# Patient Record
Sex: Female | Born: 1935 | Race: Black or African American | Hispanic: No | Marital: Single | Smoking: Former smoker
Health system: Southern US, Community
[De-identification: ages and names within clinical notes are randomized; demographics above are authoritative.]

## PROBLEM LIST (undated history)

## (undated) DIAGNOSIS — I5189 Other ill-defined heart diseases: Secondary | ICD-10-CM

## (undated) DIAGNOSIS — I200 Unstable angina: Secondary | ICD-10-CM

## (undated) DIAGNOSIS — E785 Hyperlipidemia, unspecified: Secondary | ICD-10-CM

## (undated) DIAGNOSIS — J449 Chronic obstructive pulmonary disease, unspecified: Secondary | ICD-10-CM

## (undated) DIAGNOSIS — I119 Hypertensive heart disease without heart failure: Secondary | ICD-10-CM

## (undated) DIAGNOSIS — K5790 Diverticulosis of intestine, part unspecified, without perforation or abscess without bleeding: Secondary | ICD-10-CM

## (undated) DIAGNOSIS — T7840XA Allergy, unspecified, initial encounter: Secondary | ICD-10-CM

## (undated) DIAGNOSIS — D126 Benign neoplasm of colon, unspecified: Secondary | ICD-10-CM

## (undated) DIAGNOSIS — K219 Gastro-esophageal reflux disease without esophagitis: Secondary | ICD-10-CM

## (undated) DIAGNOSIS — D649 Anemia, unspecified: Secondary | ICD-10-CM

## (undated) HISTORY — DX: Chronic obstructive pulmonary disease, unspecified: J44.9

## (undated) HISTORY — DX: Hyperlipidemia, unspecified: E78.5

## (undated) HISTORY — DX: Unstable angina: I20.0

## (undated) HISTORY — DX: Anemia, unspecified: D64.9

## (undated) HISTORY — DX: Hypertensive heart disease without heart failure: I11.9

## (undated) HISTORY — DX: Gastro-esophageal reflux disease without esophagitis: K21.9

## (undated) HISTORY — DX: Diverticulosis of intestine, part unspecified, without perforation or abscess without bleeding: K57.90

## (undated) HISTORY — DX: Other ill-defined heart diseases: I51.89

## (undated) HISTORY — DX: Allergy, unspecified, initial encounter: T78.40XA

## (undated) HISTORY — DX: Benign neoplasm of colon, unspecified: D12.6

---

## 1997-07-08 ENCOUNTER — Emergency Department (HOSPITAL_COMMUNITY): Admission: EM | Admit: 1997-07-08 | Discharge: 1997-07-08 | Payer: Self-pay | Admitting: Emergency Medicine

## 1997-07-14 ENCOUNTER — Emergency Department (HOSPITAL_COMMUNITY): Admission: EM | Admit: 1997-07-14 | Discharge: 1997-07-14 | Payer: Self-pay | Admitting: Emergency Medicine

## 1997-10-08 ENCOUNTER — Emergency Department (HOSPITAL_COMMUNITY): Admission: EM | Admit: 1997-10-08 | Discharge: 1997-10-09 | Payer: Self-pay | Admitting: Emergency Medicine

## 1998-05-17 ENCOUNTER — Emergency Department (HOSPITAL_COMMUNITY): Admission: EM | Admit: 1998-05-17 | Discharge: 1998-05-17 | Payer: Self-pay | Admitting: Emergency Medicine

## 1999-01-09 HISTORY — PX: CARDIAC CATHETERIZATION: SHX172

## 1999-07-05 ENCOUNTER — Emergency Department (HOSPITAL_COMMUNITY): Admission: EM | Admit: 1999-07-05 | Discharge: 1999-07-05 | Payer: Self-pay | Admitting: Emergency Medicine

## 1999-08-07 ENCOUNTER — Emergency Department (HOSPITAL_COMMUNITY): Admission: EM | Admit: 1999-08-07 | Discharge: 1999-08-07 | Payer: Self-pay | Admitting: Emergency Medicine

## 1999-08-27 ENCOUNTER — Emergency Department (HOSPITAL_COMMUNITY): Admission: EM | Admit: 1999-08-27 | Discharge: 1999-08-27 | Payer: Self-pay | Admitting: Emergency Medicine

## 1999-09-13 ENCOUNTER — Encounter: Payer: Self-pay | Admitting: Emergency Medicine

## 1999-09-13 ENCOUNTER — Emergency Department (HOSPITAL_COMMUNITY): Admission: EM | Admit: 1999-09-13 | Discharge: 1999-09-13 | Payer: Self-pay | Admitting: Emergency Medicine

## 1999-09-14 ENCOUNTER — Inpatient Hospital Stay (HOSPITAL_COMMUNITY): Admission: EM | Admit: 1999-09-14 | Discharge: 1999-09-16 | Payer: Self-pay | Admitting: *Deleted

## 1999-09-16 ENCOUNTER — Encounter: Payer: Self-pay | Admitting: Gastroenterology

## 1999-10-09 DIAGNOSIS — D126 Benign neoplasm of colon, unspecified: Secondary | ICD-10-CM

## 1999-10-09 HISTORY — DX: Benign neoplasm of colon, unspecified: D12.6

## 1999-10-13 ENCOUNTER — Ambulatory Visit: Admission: RE | Admit: 1999-10-13 | Discharge: 1999-10-13 | Payer: Self-pay | Admitting: Pulmonary Disease

## 1999-10-31 ENCOUNTER — Encounter: Payer: Self-pay | Admitting: Gastroenterology

## 1999-10-31 ENCOUNTER — Encounter (INDEPENDENT_AMBULATORY_CARE_PROVIDER_SITE_OTHER): Payer: Self-pay

## 1999-10-31 ENCOUNTER — Other Ambulatory Visit: Admission: RE | Admit: 1999-10-31 | Discharge: 1999-10-31 | Payer: Self-pay | Admitting: Gastroenterology

## 1999-11-15 ENCOUNTER — Encounter: Payer: Self-pay | Admitting: Internal Medicine

## 2000-03-01 ENCOUNTER — Other Ambulatory Visit: Admission: RE | Admit: 2000-03-01 | Discharge: 2000-03-01 | Payer: Self-pay | Admitting: Internal Medicine

## 2000-04-30 ENCOUNTER — Emergency Department (HOSPITAL_COMMUNITY): Admission: EM | Admit: 2000-04-30 | Discharge: 2000-05-01 | Payer: Self-pay

## 2001-05-10 ENCOUNTER — Emergency Department (HOSPITAL_COMMUNITY): Admission: EM | Admit: 2001-05-10 | Discharge: 2001-05-10 | Payer: Self-pay | Admitting: Emergency Medicine

## 2001-11-21 ENCOUNTER — Encounter: Payer: Self-pay | Admitting: Internal Medicine

## 2003-02-10 ENCOUNTER — Encounter: Admission: RE | Admit: 2003-02-10 | Discharge: 2003-02-10 | Payer: Self-pay | Admitting: Internal Medicine

## 2003-03-03 ENCOUNTER — Encounter: Admission: RE | Admit: 2003-03-03 | Discharge: 2003-03-03 | Payer: Self-pay | Admitting: Internal Medicine

## 2003-04-17 ENCOUNTER — Inpatient Hospital Stay (HOSPITAL_COMMUNITY): Admission: EM | Admit: 2003-04-17 | Discharge: 2003-04-22 | Payer: Self-pay | Admitting: Emergency Medicine

## 2003-05-12 ENCOUNTER — Encounter: Payer: Self-pay | Admitting: Internal Medicine

## 2003-10-13 ENCOUNTER — Encounter: Payer: Self-pay | Admitting: Internal Medicine

## 2004-01-21 ENCOUNTER — Ambulatory Visit: Payer: Self-pay | Admitting: Family Medicine

## 2004-01-24 ENCOUNTER — Ambulatory Visit: Payer: Self-pay | Admitting: Internal Medicine

## 2004-02-02 ENCOUNTER — Encounter: Payer: Self-pay | Admitting: Internal Medicine

## 2004-09-07 ENCOUNTER — Ambulatory Visit: Payer: Self-pay | Admitting: Internal Medicine

## 2004-09-29 ENCOUNTER — Ambulatory Visit: Payer: Self-pay | Admitting: Gastroenterology

## 2004-10-16 LAB — HM COLONOSCOPY

## 2004-10-26 ENCOUNTER — Encounter: Payer: Self-pay | Admitting: Internal Medicine

## 2004-10-26 ENCOUNTER — Ambulatory Visit: Payer: Self-pay | Admitting: Gastroenterology

## 2004-12-07 ENCOUNTER — Ambulatory Visit: Payer: Self-pay | Admitting: Internal Medicine

## 2005-04-05 ENCOUNTER — Ambulatory Visit: Payer: Self-pay | Admitting: Internal Medicine

## 2005-05-21 ENCOUNTER — Ambulatory Visit: Payer: Self-pay | Admitting: Internal Medicine

## 2005-05-25 ENCOUNTER — Ambulatory Visit: Payer: Self-pay | Admitting: Internal Medicine

## 2005-11-21 ENCOUNTER — Ambulatory Visit: Payer: Self-pay | Admitting: Internal Medicine

## 2006-04-12 ENCOUNTER — Ambulatory Visit: Payer: Self-pay | Admitting: Internal Medicine

## 2006-05-01 ENCOUNTER — Ambulatory Visit: Payer: Self-pay | Admitting: Emergency Medicine

## 2006-06-05 ENCOUNTER — Ambulatory Visit: Payer: Self-pay | Admitting: Emergency Medicine

## 2006-06-26 ENCOUNTER — Ambulatory Visit: Payer: Self-pay | Admitting: Cardiology

## 2006-07-03 ENCOUNTER — Ambulatory Visit: Payer: Self-pay | Admitting: Cardiology

## 2006-07-18 ENCOUNTER — Ambulatory Visit: Payer: Self-pay | Admitting: Emergency Medicine

## 2006-07-22 ENCOUNTER — Ambulatory Visit: Payer: Self-pay

## 2006-07-22 ENCOUNTER — Encounter: Payer: Self-pay | Admitting: Cardiology

## 2006-08-20 ENCOUNTER — Ambulatory Visit: Payer: Self-pay | Admitting: Family Medicine

## 2006-08-20 ENCOUNTER — Encounter: Payer: Self-pay | Admitting: Internal Medicine

## 2006-08-21 DIAGNOSIS — Z860100 Personal history of colon polyps, unspecified: Secondary | ICD-10-CM | POA: Insufficient documentation

## 2006-08-21 DIAGNOSIS — E785 Hyperlipidemia, unspecified: Secondary | ICD-10-CM

## 2006-08-21 DIAGNOSIS — I10 Essential (primary) hypertension: Secondary | ICD-10-CM | POA: Insufficient documentation

## 2006-08-27 ENCOUNTER — Ambulatory Visit: Payer: Self-pay | Admitting: Emergency Medicine

## 2006-10-03 ENCOUNTER — Ambulatory Visit: Payer: Self-pay | Admitting: Emergency Medicine

## 2006-10-10 ENCOUNTER — Ambulatory Visit: Payer: Self-pay | Admitting: Emergency Medicine

## 2006-10-10 ENCOUNTER — Ambulatory Visit: Admission: RE | Admit: 2006-10-10 | Discharge: 2006-10-10 | Payer: Self-pay | Admitting: Emergency Medicine

## 2006-10-10 ENCOUNTER — Encounter: Payer: Self-pay | Admitting: Emergency Medicine

## 2006-10-23 ENCOUNTER — Ambulatory Visit: Payer: Self-pay | Admitting: Internal Medicine

## 2006-11-05 ENCOUNTER — Encounter: Admission: RE | Admit: 2006-11-05 | Discharge: 2007-01-08 | Payer: Self-pay | Admitting: Emergency Medicine

## 2006-11-18 ENCOUNTER — Ambulatory Visit: Payer: Self-pay | Admitting: Emergency Medicine

## 2007-04-21 ENCOUNTER — Telehealth (INDEPENDENT_AMBULATORY_CARE_PROVIDER_SITE_OTHER): Payer: Self-pay | Admitting: *Deleted

## 2007-04-28 ENCOUNTER — Ambulatory Visit: Payer: Self-pay | Admitting: Emergency Medicine

## 2007-04-28 DIAGNOSIS — J309 Allergic rhinitis, unspecified: Secondary | ICD-10-CM | POA: Insufficient documentation

## 2007-05-01 ENCOUNTER — Ambulatory Visit: Payer: Self-pay | Admitting: Internal Medicine

## 2007-05-22 ENCOUNTER — Encounter: Payer: Self-pay | Admitting: Emergency Medicine

## 2007-05-29 ENCOUNTER — Encounter: Payer: Self-pay | Admitting: Internal Medicine

## 2007-05-29 ENCOUNTER — Ambulatory Visit: Payer: Self-pay

## 2007-06-06 ENCOUNTER — Ambulatory Visit: Payer: Self-pay | Admitting: Emergency Medicine

## 2007-11-24 ENCOUNTER — Ambulatory Visit: Payer: Self-pay | Admitting: Internal Medicine

## 2007-11-24 ENCOUNTER — Other Ambulatory Visit: Admission: RE | Admit: 2007-11-24 | Discharge: 2007-11-24 | Payer: Self-pay | Admitting: Internal Medicine

## 2007-11-24 ENCOUNTER — Encounter: Payer: Self-pay | Admitting: Internal Medicine

## 2007-11-24 LAB — HM PAP SMEAR

## 2007-12-18 ENCOUNTER — Ambulatory Visit: Payer: Self-pay | Admitting: Internal Medicine

## 2007-12-19 LAB — CONVERTED CEMR LAB
ALT: 14 units/L (ref 0–35)
AST: 18 units/L (ref 0–37)
Albumin: 4 g/dL (ref 3.5–5.2)
BUN: 15 mg/dL (ref 6–23)
Basophils Absolute: 0 10*3/uL (ref 0.0–0.1)
Basophils Relative: 0.3 % (ref 0.0–3.0)
CO2: 28 meq/L (ref 19–32)
Calcium: 10.4 mg/dL (ref 8.4–10.5)
Creatinine, Ser: 1.2 mg/dL (ref 0.4–1.2)
Direct LDL: 121.2 mg/dL
Eosinophils Relative: 2.3 % (ref 0.0–5.0)
Glucose, Bld: 91 mg/dL (ref 70–99)
Hemoglobin: 10.5 g/dL — ABNORMAL LOW (ref 12.0–15.0)
Lymphocytes Relative: 33.6 % (ref 12.0–46.0)
MCHC: 33.1 g/dL (ref 30.0–36.0)
Neutro Abs: 3.7 10*3/uL (ref 1.4–7.7)
RBC: 3.56 M/uL — ABNORMAL LOW (ref 3.87–5.11)
TSH: 2.56 microintl units/mL (ref 0.35–5.50)
Total Bilirubin: 0.6 mg/dL (ref 0.3–1.2)
Total Protein: 7.9 g/dL (ref 6.0–8.3)
VLDL: 32 mg/dL (ref 0–40)
WBC: 6.6 10*3/uL (ref 4.5–10.5)

## 2007-12-25 ENCOUNTER — Telehealth: Payer: Self-pay | Admitting: Internal Medicine

## 2008-01-05 ENCOUNTER — Ambulatory Visit: Payer: Self-pay | Admitting: Internal Medicine

## 2008-01-20 ENCOUNTER — Ambulatory Visit: Payer: Self-pay | Admitting: Gastroenterology

## 2008-01-20 ENCOUNTER — Telehealth: Payer: Self-pay | Admitting: Gastroenterology

## 2008-01-20 DIAGNOSIS — D649 Anemia, unspecified: Secondary | ICD-10-CM | POA: Insufficient documentation

## 2008-01-21 LAB — CONVERTED CEMR LAB
Folate: 16.5 ng/mL
Vitamin B-12: 804 pg/mL (ref 211–911)

## 2008-01-26 ENCOUNTER — Encounter: Payer: Self-pay | Admitting: Internal Medicine

## 2008-01-29 ENCOUNTER — Ambulatory Visit: Payer: Self-pay | Admitting: Gastroenterology

## 2008-01-30 LAB — CONVERTED CEMR LAB
OCCULT 1: NEGATIVE
OCCULT 3: NEGATIVE
OCCULT 4: NEGATIVE

## 2008-03-01 ENCOUNTER — Ambulatory Visit: Payer: Self-pay | Admitting: Gastroenterology

## 2008-03-08 ENCOUNTER — Ambulatory Visit: Payer: Self-pay | Admitting: Gastroenterology

## 2008-03-08 ENCOUNTER — Encounter: Payer: Self-pay | Admitting: Gastroenterology

## 2008-03-08 LAB — HM COLONOSCOPY

## 2008-03-09 ENCOUNTER — Encounter: Payer: Self-pay | Admitting: Gastroenterology

## 2008-03-10 ENCOUNTER — Telehealth: Payer: Self-pay | Admitting: Gastroenterology

## 2008-08-12 ENCOUNTER — Telehealth: Payer: Self-pay | Admitting: Internal Medicine

## 2008-08-18 ENCOUNTER — Encounter: Payer: Self-pay | Admitting: Internal Medicine

## 2008-10-11 ENCOUNTER — Encounter: Payer: Self-pay | Admitting: Internal Medicine

## 2008-10-11 ENCOUNTER — Ambulatory Visit: Payer: Self-pay | Admitting: Internal Medicine

## 2008-12-30 ENCOUNTER — Ambulatory Visit: Payer: Self-pay | Admitting: Internal Medicine

## 2008-12-30 LAB — CONVERTED CEMR LAB
Iron: 70 ug/dL (ref 42–145)
Transferrin: 256.7 mg/dL (ref 212.0–360.0)

## 2008-12-31 ENCOUNTER — Encounter: Payer: Self-pay | Admitting: Internal Medicine

## 2009-01-11 LAB — CONVERTED CEMR LAB
ALT: 16 units/L (ref 0–35)
AST: 18 units/L (ref 0–37)
BUN: 12 mg/dL (ref 6–23)
Basophils Relative: 0.8 % (ref 0.0–3.0)
Bilirubin, Direct: 0.1 mg/dL (ref 0.0–0.3)
Chloride: 106 meq/L (ref 96–112)
Creatinine, Ser: 1.1 mg/dL (ref 0.4–1.2)
Eosinophils Relative: 3.5 % (ref 0.0–5.0)
GFR calc non Af Amer: 62.44 mL/min (ref >60)
HCT: 33.8 % — ABNORMAL LOW (ref 36.0–46.0)
HDL: 50.8 mg/dL (ref >39.00)
MCV: 90.1 fL (ref 78.0–100.0)
Monocytes Absolute: 0.5 10*3/uL (ref 0.1–1.0)
Monocytes Relative: 7.9 % (ref 3.0–12.0)
Neutrophils Relative %: 50.7 % (ref 43.0–77.0)
Platelets: 291 10*3/uL (ref 150.0–400.0)
Potassium: 4.3 meq/L (ref 3.5–5.1)
RBC: 3.75 M/uL — ABNORMAL LOW (ref 3.87–5.11)
Total Bilirubin: 0.7 mg/dL (ref 0.3–1.2)
Total CHOL/HDL Ratio: 5
Total Protein: 8.2 g/dL (ref 6.0–8.3)
VLDL: 32.6 mg/dL (ref 0.0–40.0)
WBC: 5.8 10*3/uL (ref 4.5–10.5)

## 2009-01-14 ENCOUNTER — Ambulatory Visit: Payer: Self-pay | Admitting: Internal Medicine

## 2009-06-10 ENCOUNTER — Encounter: Payer: Self-pay | Admitting: Internal Medicine

## 2010-01-24 ENCOUNTER — Other Ambulatory Visit: Payer: Self-pay | Admitting: Internal Medicine

## 2010-01-24 ENCOUNTER — Ambulatory Visit
Admission: RE | Admit: 2010-01-24 | Discharge: 2010-01-24 | Payer: Self-pay | Source: Home / Self Care | Attending: Internal Medicine | Admitting: Internal Medicine

## 2010-01-24 DIAGNOSIS — M949 Disorder of cartilage, unspecified: Secondary | ICD-10-CM

## 2010-01-24 DIAGNOSIS — M899 Disorder of bone, unspecified: Secondary | ICD-10-CM | POA: Insufficient documentation

## 2010-01-24 DIAGNOSIS — J449 Chronic obstructive pulmonary disease, unspecified: Secondary | ICD-10-CM | POA: Insufficient documentation

## 2010-01-24 LAB — CBC WITH DIFFERENTIAL/PLATELET
Basophils Absolute: 0 10*3/uL (ref 0.0–0.1)
Basophils Relative: 0.4 % (ref 0.0–3.0)
Eosinophils Absolute: 0.1 10*3/uL (ref 0.0–0.7)
Eosinophils Relative: 1.6 % (ref 0.0–5.0)
HCT: 37.3 % (ref 36.0–46.0)
Hemoglobin: 12.2 g/dL (ref 12.0–15.0)
Lymphocytes Relative: 33.1 % (ref 12.0–46.0)
Lymphs Abs: 2.4 10*3/uL (ref 0.7–4.0)
MCHC: 32.8 g/dL (ref 30.0–36.0)
MCV: 89.3 fl (ref 78.0–100.0)
Monocytes Absolute: 0.4 10*3/uL (ref 0.1–1.0)
Monocytes Relative: 6.1 % (ref 3.0–12.0)
Neutro Abs: 4.2 10*3/uL (ref 1.4–7.7)
Neutrophils Relative %: 58.8 % (ref 43.0–77.0)
Platelets: 320 10*3/uL (ref 150.0–400.0)
RBC: 4.18 Mil/uL (ref 3.87–5.11)
RDW: 15.6 % — ABNORMAL HIGH (ref 11.5–14.6)
WBC: 7.2 10*3/uL (ref 4.5–10.5)

## 2010-01-24 LAB — LIPID PANEL
Cholesterol: 224 mg/dL — ABNORMAL HIGH (ref 0–200)
HDL: 60.5 mg/dL (ref >39.00)
Total CHOL/HDL Ratio: 4
Triglycerides: 132 mg/dL (ref 0.0–149.0)
VLDL: 26.4 mg/dL (ref 0.0–40.0)

## 2010-01-24 LAB — BASIC METABOLIC PANEL
BUN: 8 mg/dL (ref 6–23)
CO2: 29 mEq/L (ref 19–32)
Calcium: 11.4 mg/dL — ABNORMAL HIGH (ref 8.4–10.5)
Chloride: 103 mEq/L (ref 96–112)
Creatinine, Ser: 0.9 mg/dL (ref 0.4–1.2)
GFR: 75.57 mL/min (ref >60.00)
Glucose, Bld: 85 mg/dL (ref 70–99)
Potassium: 4.4 mEq/L (ref 3.5–5.1)
Sodium: 142 mEq/L (ref 135–145)

## 2010-01-24 LAB — TSH: TSH: 2.44 u[IU]/mL (ref 0.35–5.50)

## 2010-01-24 LAB — HEPATIC FUNCTION PANEL
ALT: 17 U/L (ref 0–35)
AST: 20 U/L (ref 0–37)
Albumin: 4.6 g/dL (ref 3.5–5.2)
Alkaline Phosphatase: 71 U/L (ref 39–117)
Bilirubin, Direct: 0.1 mg/dL (ref 0.0–0.3)
Total Bilirubin: 0.6 mg/dL (ref 0.3–1.2)
Total Protein: 8.4 g/dL — ABNORMAL HIGH (ref 6.0–8.3)

## 2010-01-24 LAB — LDL CHOLESTEROL, DIRECT: Direct LDL: 144.2 mg/dL

## 2010-02-09 NOTE — Assessment & Plan Note (Signed)
Summary: EMP/PT COMING IN FASTING/CJR/PT RSC/CJR   Vital Signs:  Patient profile:   77 year old female Height:      63 inches Weight:      191 pounds BMI:     33.96 Temp:     98.7 degrees F oral Pulse rate:   84 / minute Pulse rhythm:   regular BP sitting:   138 / 80  (left arm) Cuff size:   large  Vitals Entered By: Alfred Levins, CMA (January 24, 2010 9:21 AM) CC: cpx, fasting   Primary Care Provider:  Birdie Sons, MD  CC:  cpx and fasting.  History of Present Illness: Here for Medicare AWV:  1.   Risk factors based on Past M, S, F history: see documentation 2.   Physical Activities:  able to do everything she wants 3.   Depression/mood: no complaints 4.   Hearing: -no concerns 5.   ADL's: --able to do all 6.   Fall Risk: - no concerns 7.   Home Safety: -no concerns 8.   Height, weight, &visual acuity: see exam, advised eye exam 9.   Counseling: --advised regular exercise, low calorie diet 10.   Labs ordered based on risk factors: --see labs 11.           Referral Coordinationnone necessary 12.           Care Plan: advised weight loss 13.            Cognitive Assessment : no concerns identified  Current Problems:  ANEMIA (ICD-285.9)--needs f/u COPD (ICD-496)---quit 1998 OSTEOPOROSIS (ICD-733.00): never treated---reviewed dexa scans HYPERTENSION (ICD-401.9)--tolerating meds HYPERLIPIDEMIA (ICD-272.4)--needs f/u    Current Problems (verified): 1)  Health Maintenance Exam  (ICD-V70.0) 2)  Anemia  (ICD-285.9) 3)  Allergic Rhinitis  (ICD-477.9) 4)  COPD  (ICD-496) 5)  Osteoporosis  (ICD-733.00) 6)  Hypertension  (ICD-401.9) 7)  Hyperlipidemia  (ICD-272.4) 8)  Colonic Polyps, Hx of  (ICD-V12.72)  Current Medications (verified): 1)  Multivitamins   Tabs (Multiple Vitamin) .... One Once Daily 2)  Losartan Potassium-Hctz 100-25 Mg Tabs (Losartan Potassium-Hctz) .... Take 1 Tablet By Mouth Once A Day 3)  Claritin 10 Mg  Tabs (Loratadine) .... One Once Daily 4)   Proair Hfa 108 (90 Base) Mcg/act  Aers (Albuterol Sulfate) .... Every Four To Six Hours Prn 5)  Furosemide 20 Mg  Tabs (Furosemide) .... One Daily As Needed For Edema 6)  Caltrate 600 1500 Mg Tabs (Calcium Carbonate) .... Once Daily 7)  Albuterol Sulfate (2.5 Mg/6ml) 0.083% Nebu (Albuterol Sulfate) .... As Directed Prn  Allergies (verified): 1)  ! Protonix (Pantoprazole Sodium)  Past History:  Past Medical History: Last updated: 01/20/2008 Asthma--since age 54 Tubular adenoma colon polyp 10/1999 GERD Hyperlipidemia Hypertension Osteoporosis COPD Allergic rhinitis Obesity Anemia Diverticulosis Hemorrhoids Pneumonia  Past Surgical History: Last updated: 01/20/2008 Colonoscopy-2006 Cardiac cath-2001  Family History: Last updated: 01/20/2008 Family History of Asthma Family History Diabetes 1st degree relative Fam hx CAD Family History of Heart Disease: Mother  Social History: Last updated: 01/20/2008 Former Smoker Alcohol use-yes Occupation: Retired Daily Caffeine Use 1-2 weekly Illicit Drug Use - no  Risk Factors: Smoking Status: quit (08/21/2006)  Physical Exam  General:   overweight female in no acute distress. EENT exam atraumatic, normocephalic, extraocular muscles are intact. Neck is supple without lymphadenopathy or thyromegaly. Chest clear to auscultation without increased work of breathing. Cardiac exam S1-S2 are regular. Abdominal exam active bowel sounds, soft, nontender. Extremities there is no clubbing cyanosis or edema.  Neurologic exam alert and oriented without any motor or sensory deficits. Gait is normal.   Impression & Recommendations:  Problem # 1:  HEALTH MAINTENANCE EXAM (ICD-V70.0)  flu immunization given today. We'll check on  shingles vaccine.  Problem # 2:  HYPERTENSION (ICD-401.9)  adequate control. Continue current medications. Her updated medication list for this problem includes:    Losartan Potassium-hctz 100-25 Mg Tabs (Losartan  potassium-hctz) .Marland Kitchen... Take 1 tablet by mouth once a day    Furosemide 20 Mg Tabs (Furosemide) ..... One daily as needed for edema  BP today: 138/80 Prior BP: 122/64 (12/30/2008)  Labs Reviewed: K+: 4.3 (12/30/2008) Creat: : 1.1 (12/30/2008)   Chol: 236 (12/30/2008)   HDL: 50.80 (12/30/2008)   LDL: DEL (11/24/2007)   TG: 163.0 (12/30/2008)  Orders: Venipuncture (78295) Specimen Handling (62130) TLB-BMP (Basic Metabolic Panel-BMET) (80048-METABOL) TLB-TSH (Thyroid Stimulating Hormone) (84443-TSH)  Problem # 3:  HYPERLIPIDEMIA (ICD-272.4)  we'll check today. Orders: Venipuncture (86578) Specimen Handling (46962) TLB-Lipid Panel (80061-LIPID) TLB-Hepatic/Liver Function Pnl (80076-HEPATIC)  Labs Reviewed: SGOT: 18 (12/30/2008)   SGPT: 16 (12/30/2008)   HDL:50.80 (12/30/2008), 35.5 (11/24/2007)  LDL:DEL (11/24/2007)  Chol:236 (12/30/2008), 211 (11/24/2007)  Trig:163.0 (12/30/2008), 162 (11/24/2007)  Problem # 4:  COLONIC POLYPS, HX OF (ICD-V12.72) reviewed colonoscopy  Complete Medication List: 1)  Multivitamins Tabs (Multiple vitamin) .... One once daily 2)  Losartan Potassium-hctz 100-25 Mg Tabs (Losartan potassium-hctz) .... Take 1 tablet by mouth once a day 3)  Claritin 10 Mg Tabs (Loratadine) .... One once daily 4)  Proair Hfa 108 (90 Base) Mcg/act Aers (Albuterol sulfate) .... Every four to six hours prn 5)  Furosemide 20 Mg Tabs (Furosemide) .... One daily as needed for edema 6)  Caltrate 600 1500 Mg Tabs (Calcium carbonate) .... Once daily 7)  Albuterol Sulfate (2.5 Mg/76ml) 0.083% Nebu (Albuterol sulfate) .... As directed prn  Other Orders: TLB-CBC Platelet - w/Differential (85025-CBCD) UA Dipstick w/o Micro (manual) (95284) Admin 1st Vaccine (13244) Flu Vaccine 9yrs + (01027)   Orders Added: 1)  Venipuncture [25366] 2)  Specimen Handling [99000] 3)  TLB-Lipid Panel [80061-LIPID] 4)  TLB-Hepatic/Liver Function Pnl [80076-HEPATIC] 5)  TLB-BMP (Basic Metabolic  Panel-BMET) [80048-METABOL] 6)  TLB-CBC Platelet - w/Differential [85025-CBCD] 7)  TLB-TSH (Thyroid Stimulating Hormone) [84443-TSH] 8)  UA Dipstick w/o Micro (manual) [81002] 9)  Admin 1st Vaccine [90471] 10)  Flu Vaccine 38yrs + [44034] Flu Vaccine Consent Questions     Do you have a history of severe allergic reactions to this vaccine? no    Any prior history of allergic reactions to egg and/or gelatin? no    Do you have a sensitivity to the preservative Thimersol? no    Do you have a past history of Guillan-Barre Syndrome? no    Do you currently have an acute febrile illness? no    Have you ever had a severe reaction to latex? no    Vaccine information given and explained to patient? yes    Are you currently pregnant? no    Lot Number:[id number]   Exp Date:07/08/2010   Site Given  Left Deltoid IM 7)  TLB-TSH (Thyroid Stimulating Hormone) [84443-TSH] 8)  UA Dipstick w/o Micro (manual) [81002] 9)  Admin 1st Vaccine [90471] 10)  Flu Vaccine 65yrs + [74259]      .lbflu  Appended Document: Orders Update     Clinical Lists Changes  Orders: Added new Service order of Specimen Handling (56387) - Signed      Appended Document: EMP/PT COMING IN FASTING/CJR/PT RSC/CJR  Nurse Visit   Allergies: 1)  ! Protonix (Pantoprazole Sodium)  Immunizations Administered:  Zostavax # 1:    Vaccine Type: Zostavax    Site: right arm    Mfr: Merck    Dose: 0.5 ml    Route:     Given by: Alfred Levins, CMA    Exp. Date: 11/09/2010    Lot #: [id number]  Orders Added: 1)  Admin 1st Vaccine [90471]   Appended Document: EMP/PT COMING IN FASTING/CJR/PT RSC/CJR   ANTICOAGULATION RECORD  NEW REGIMEN & LAB RESULTS Regimen:   (no change)    Laboratory Results   Urine Tests    Routine Urinalysis   Color: yellow Appearance: Clear Glucose: negative   (Normal Range: Negative) Bilirubin: negative   (Normal Range: Negative) Ketone: negative   (Normal Range:  Negative) Spec. Gravity: 1.020   (Normal Range: 1.003-1.035) Blood: trace-intact   (Normal Range: Negative) pH: 5.5   (Normal Range: 5.0-8.0) Protein: negative   (Normal Range: Negative) Urobilinogen: 0.2   (Normal Range: 0-1) Nitrite: negative   (Normal Range: Negative) Leukocyte Esterace: trace   (Normal Range: Negative)    Comments: Rita Ohara  January 24, 2010 11:10 AM

## 2010-05-23 NOTE — Assessment & Plan Note (Signed)
HEALTHCARE                            CARDIOLOGY OFFICE NOTE   Heidi Hoffman, Heidi Hoffman                       MRN:          [id number]  DATE:07/03/2006                            DOB:          Nov 26, 1932    The patient is a 77 year old female who I am asked to consult on  concerning dyspnea and chest pain.  She has been seen previously in  September of 2001.  At that time she was complaining of chest pain.  She  had a cardiac catheterization that showed normal LV function with an  ejection fraction of 74%.  There was no coronary disease noted.  She now  states that she is having worsening dyspnea on exertion.  Note, this has  been a long term problem, but it is worse at present.  There is no  orthopnea, PND, palpitations, presyncope, or syncope.  She will  occasionally have minimal pedal edema.  She also has chest tightness  when she exerts herself, which, again, has been a long term problem, but  also worse recently.  It does increase with inspiration.  It does not  radiate.  There is no associated nausea, vomiting, or diaphoresis.  It  does not occur at rest.  She has been treated for asthma, but we were  asked to evaluate to make sure that this was not a cardiac issue.   CURRENT MEDICATIONS:  1. Benicar HCT 20/12.5 mg p.o. daily.  2. Multivitamin.  3. Caltrate.  4. Advair.   She has an allergy to PROTONIX.   FAMILY HISTORY:  Negative for coronary artery disease (her mother did  have myocardial infarction, but it was __________ ).   SOCIAL HISTORY:  She has a remote history of tobacco use, but has not  smoked since her cardiac catheterization in 2001.  She occasionally  consumes alcohol.  She lives alone in [place] but does have a son in  the area.  She also has children in [place].   PAST MEDICAL HISTORY:  Significant for hypertension and hyperlipidemia,  but there is no diabetes mellitus. She does have a long history of  asthma, since  the age of 4.  She also has allergic rhinitis.  She has  had no previous surgeries.   REVIEW OF SYSTEMS:  She denies any headaches, fevers, or chills.  She  has had a cough productive of phlegm.  There is no hemoptysis.  She  denies any dysphagia, odynophagia, melena or hematochezia.  There is no  dysuria, hematuria.  There is no rash or seizure activity.  There is no  orthopnea or PND, but it can occasionally be minimal pedal edema.  She  occasionally has pain in her joints with ambulation.  The remaining  systems are negative.   PHYSICAL EXAMINATION:  Shows a blood pressure of 138/74, and her pulse  is 76.  She weighs 199 pounds.  She is well developed and mildly obese.  She is in no acute distress.  Her skin is warm and dry.  She does not appear to be depressed and there is no peripheral clubbing.  HEENT:  Normal with normal eyelids.  NECK:  Supple with no bruits.  There is no jugular venous distension and  no thyromegaly is noted.  CHEST:  Shows a minimal expiratory wheeze with forced expiration, but is  otherwise clear with normal expansion.  Her cardiovascular exam reveals a regular rate and rhythm with normal S1  and S2.  There are no murmurs, rubs, or gallops.  ABDOMINAL EXAM:  Not tender, nondistended, positive bowel sounds, no  hepatosplenomegaly, no masses appreciated.  There is no abdominal bruit.  She has 2+ femoral pulses bilaterally and no bruits.  EXTREMITIES:  Show no edema, I could palpate no cords.  She has 2+  dorsalis pedis pulses bilaterally.  NEUROLOGICAL EXAM:  Grossly intact.   Her electrocardiogram shows a sinus rhythm at a rate of 67.  The axis is  normal.  There are no significant ST changes.   DIAGNOSES:  1. Exertional chest discomfort:  Note, this has been a long standing      issue for her, but it is worse recently.  She did have a      catheterization in 2001 that showed normal coronary arteries.      There is also a pleuritic component.  I think  it is most likely      related to her asthma, but we will plan to proceed with her Myoview      for risk stratification.  If it shows normal perfusion, then we      will not pursue further cardiac evaluation.  2. Dyspnea:  This also appears to be related to her asthma.  It is      also a long term issue.  It is worse recently.  We will schedule      her to have an echocardiogram to quantify left ventricular      function.  If her Myoview and echocardiogram are normal, then we      will not pursue further cardiac evaluation.  She would follow up      with Dr. Delton Coombes for treatment of her asthma.  3. Hypertension:  Her blood pressure is well controlled on her present      medications.  4. History of hyperlipidemia:  Per her primary care physician.  5. Asthma:  Per Dr. Delton Coombes.     Madolyn Frieze Jens Som, MD, [facility]  Electronically Signed    BSC/MedQ  DD: 07/03/2006  DT: 07/03/2006  Job #: [id number]   cc:   Leslye Peer, MD

## 2010-05-23 NOTE — Assessment & Plan Note (Signed)
[facility]                             PULMONARY OFFICE NOTE   LYSHA, Hoffman                       MRN:          [id number]  DATE:11/18/2006                            DOB:          11-13-1932    SUBJECTIVE:  Heidi Hoffman is a pleasant 77 year old woman with a history  of a chronic cough and associated dyspnea.  She was seen in our office  on October 23, 2006, by Dr. Kalman Shan following a bronchoscopy  performed by myself on October 10, 2006.  At that time she was having  blood-tinged sputum.  It was suspected that this was due to a mild  tracheobronchitis from her severe coughing.  She was reassured and no  other treatment was given.  Her hemoptysis has since resolved.  She  tells me that her cough is improved.  This is surprising, in that she is  not currently on her proton pump inhibitor or her nasal steroid, due to  insurance issues and cost.  She was referred to speech therapy.  She  went for her initial visit and they told her that they did not know why  she was there.  They evaluated her for her hoarse voice and wanted to  refer her to Ear/Nose/Throat.  It may be that records indicating that  she has vocal cord dysfunction did not accompany her to that  appointment.   CURRENT MEDICATIONS:  1. Multivitamin once daily.  2. Caltrate once daily.  3. Benicar/hydrochlorothiazide 20/12.5 mg daily.  4. Claritin 10 mg daily.  5. Albuterol q.4h. p.r.n. shortness of breath.   PHYSICAL EXAMINATION:  GENERAL:  This pleasant obese woman, in no  distress.  VITAL SIGNS:  Weight 201 pounds, temperature 98.1 degrees, blood  pressure 123/70, heart rate 59, SpO2 98% on room air.  HEENT:  She continues to have a hoarse voice but it is improved compared  with our previous exams.  NECK:  Supple.  She does not have any lymphadenopathy or stridor.  LUNGS:  Air movement in her upper airway is much improved, in fact the  best I have ever heard.  Her  lungs are clear to auscultation  bilaterally.  She does have some decreased breath sounds in both bases.  ABDOMEN:  Obese, benign.  EXTREMITIES:  No clubbing, cyanosis or edema.   IMPRESSION:  1. Vocal cord dysfunction documented by bronchoscopy, with chronic      cough and episodic dyspnea.  2. Probable restrictive lung disease superimposed on the above, due to      obesity.  3. Recent episode of hemoptysis, which has resolved.  4. Allergic rhinitis.  5. Gastroesophageal reflux disease.   PLAN:  I will continue her on her Claritin daily and I would like to  start her on another nasal steroid.  I will use Flonase, since  fluticasone is generic.  I will also restart her on Omeprazole 20 mg  twice daily, to manage her reflux disease.  I would like to refer her  back to speech therapy and make it clear to them that she carries a  diagnosis of vocal cord dysfunction and needs to review techniques, to  improve function and to facilitate cord relaxation when she develops  dyspnea and cough.   FOLLOWUP:  I will follow up with Heidi Hoffman in three months or sooner,  if she has any difficulty in the interim.     Leslye Peer, MD  Electronically Signed    RSB/MedQ  DD: 11/18/2006  DT: 11/18/2006  Job #: [id number]   cc:   Madolyn Frieze. Jens Som, MD, [facility]

## 2010-05-23 NOTE — Assessment & Plan Note (Signed)
[facility]                             PULMONARY OFFICE NOTE   Heidi Hoffman, Heidi Hoffman                       MRN:          [id number]  DATE:08/27/2006                            DOB:          07/06/1932    SUBJECTIVE:  Heidi Hoffman is a 77 year old woman with a history of remote  tobacco use who follows up today for her chronic cough and intermittent  dyspnea.  She has mixed lung disease on PFTs with some mild air flow  limitation that likely represents COPD versus fixed asthma.  Most of her  symptoms have been localized to her upper airway, and we have made  efforts to treat possible exacerbating factors, including post-nasal  drip and gastroesophageal reflux.  She is now using claritin and  Nasacort, and feels that her nasal congestion is improved.  She is on  AcipHex 20 mg once daily, but continues to have hoarse voice, frequent  paroxysms of cough through the day, and particularly at night when she  lies supine.  She feels that, when she does lie flat stuff comes up  and irritates her throat.  At our last visit, we discontinued her Advair  to see if this medication was a possible contributor to her upper airway  irritation.  She has not missed the medication with regard to her  breathing, but her cough has not really improved.   CURRENT MEDICATIONS:  1. Multivitamin once daily.  2. Caltrate once daily.  3. Benicar hydrochlorothiazide 20/12.5 mg daily.  4. Claritin 10 mg daily.  5. Nasacort AQ 2 sprays to each nostril daily.  6. AcipHex 20 mg p.o. daily.  7. Albuterol q.4h p.r.n. shortness of breath.   EXAM:  GENERAL:  This is a pleasant overweight African-American woman  who is in no distress.  Her weight is 199 pounds, temperature 98.2, blood pressure 118/72, heart  rate 58, SpO2 97% on room air.  HEENT:  She has a hoarse voice.  NECK:  Supple without any lymphadenopathy.  She has stridor throughout expiration and part of inspiration.  Her  lungs are significant for transmitted upper airway sounds.  There are no  crackles.  HEART:  Borderline bradycardic without murmur.  ABDOMEN:  Obese, soft, and nontender with positive bowel sounds.  EXTREMITIES:  No cyanosis, clubbing, or edema.   IMPRESSION:  1. Mixed lung disease with mild restriction and mild air flow      limitation, likely due to chronic obstructive pulmonary disease.  2. Upper airway irritation and chronic cough that can be paroxysmal in      nature and occasionally causes associated dyspnea.  3. Allergic rhinitis, which appears to be improved on claritin and      Nasacort.  4. Gastroesophageal reflux disease, which continues to be problematic      and is likely a large contributor to her upper airway irritation.   PLAN:  1. I will increase her AcipHex to 20 mg b.i.d. to see if this improves      her GERD and thereby her cough and upper airway symptoms.  2. She will continue  her Nasacort and claritin as ordered.  3. I will not start any standing bronchodilators at this time.  She      will continue to use an albuterol on as needed basis.  4. Heidi Hoffman will come back to see me in 1 month.  If she is not      improving at that time, then I believe we need to schedule      fiberoptic bronchoscopy to visualize her upper airway and ensure      that she does not have a structural lesion that is contributing to      her hoarseness and cough.     Leslye Peer, MD  Electronically Signed    RSB/MedQ  DD: 08/27/2006  DT: 08/27/2006  Job #: [id number]   cc:   Madolyn Frieze. Jens Som, MD, [facility]

## 2010-05-23 NOTE — Op Note (Signed)
Heidi Hoffman, Heidi Hoffman              ACCOUNT NO.:  [id number]   MEDICAL RECORD NO.:  [id number]          PATIENT TYPE:  AMB   LOCATION:  CARD                         FACILITY[facility]  [facility]   PHYSICIAN:  Leslye Peer, MD    DATE OF BIRTH:  February 22, 1932   DATE OF PROCEDURE:  10/10/2006  DATE OF DISCHARGE:                               OPERATIVE REPORT   PROCEDURE:  Fiberoptic bronchoscopy, with bronchoalveolar lavage.   OPERATOR:  Leslye Peer, M.D.   INDICATION:  Cough.   MEDICATIONS GIVEN:  1. Fentanyl 50 mcg IV.  2. Versed 2 mg IV.  3. Lidocaine 1%, 16 cc total to the bronchoalveolar tree.   CONSENT:  Informed consent was obtained from the patient, and a signed  copy is on her hospital chart.   PROCEDURE DETAILS:  After informed consent was obtained, as outlined  above, conscious sedation was initiated, as outlined above.  The  bronchoscope was introduced through the right naris without difficulty.  The posterior pharynx was visualized.  The vocal cords were normal in  general appearance, with some prominent false cords.  They opened fully  with inspiration and moved normally with phonation.  With suctioning and  with introduction of lidocaine, there was significant anterior chinking  of the cords, consistent with irritation and possible vocal cord  dysfunction.  The chinking resolved when the patient was asked to take a  deep breath.  The trachea was intubated, and local anesthesia was  achieved with 1% lidocaine to the large airways.  There was a prominent  proximal cartilaginous ring in the trachea, without any other  significant associate abnormalities.  Inspection of the airways showed  normal right and left mainstem bronchi.  There was some ectasia of the  airways and collapsibility of the right mainstem and bronchus  intermedius.  Collapsibility was also noted in the left lower lobe  bronchus.  Inspection was performed in the right upper lobe, bronchus  intermedius, right  middle lobe, and right lower lobe airways.  No  abnormal secretions or endobronchial lesions were noted.  Attention was  then turned to the left side, and inspection was performed in the left  upper lobe lingula and left lower lobe bronchi. Again, no abnormal  secretions or endobronchial lesions were seen.  Bronchoalveolar lavage  was then performed from the apical subsegment of the right upper lobe,  with 40 cc of normal saline instilled and approximately 10 cc returned.  This will be sent for microbiology, as detailed below.  The patient  tolerated the procedure well.  There was no blood loss.  Her vital signs  were stable throughout, and she returned to recovery in good condition.   SAMPLES:  Bronchoalveolar lavage from the right upper lobe to be sent  for bacterial culture, fungal smear and culture, and AFB smear and  cultures.  This will also be sent for cytology.   PLANS:  I will follow up the microbiology results with Ms. [place].  Given no evidence of an endobronchial lesion as a cause for her cough, I  believe that we need to aggressively treat  her vocal cord irritation  with maximal therapy for GERD and allergic rhinitis.      Leslye Peer, MD  Electronically Signed     RSB/MEDQ  D:  10/10/2006  T:  10/11/2006  Job:  [id number]

## 2010-05-23 NOTE — Assessment & Plan Note (Signed)
[facility]                             PULMONARY OFFICE NOTE   Heidi Hoffman, Heidi Hoffman                       MRN:          [id number]  DATE:07/18/2006                            DOB:          30-Dec-1932    SUBJECTIVE:  Heidi Hoffman is a 77 year old woman who follows up today for  her shortness of breath and wheezing, as well as persistent cough. She  was last seen at the end of May, and at that time her PFTs showed mixed  lung disease without a bronchodilator response. I stopped her Lisinopril  and started her on Benicar 20/12.5 mg daily. I also arranged for a high  resolution CT scan of her chest to ensure that her restriction was not  due to interstitial lung disease. She has had these tests and is here to  discuss the results today. She has also been seen in the cardiology  office by Dr. Olga Millers with regard to her exertional chest  discomfort. There are plans in place for her to have a stress test and  an echocardiogram in the coming week. With regard to her wheezing, she  tells me that she continues to have upper airway noise associated with  cough. She brings up white mucus. Her wheezing is worse when she exerts  herself. She is not sure that she has benefited significantly from  Advair or from p.r.n. albuterol use, although she did use an albuterol  nebulizer this morning with some relief. She did not restart Singulair,  but she has tried to restart her Loratadine. She discontinued the  Loratadine because she was concerned about possible side effects. She  tells me that she has GERD symptoms about 2-3 times per week and is on  no therapy for this.   CURRENT MEDICATIONS:  1. Advair 500/50 1 inhalation b.i.d.  2. Multivitamin once daily.  3. Caltrate once daily.  4. Centrum Silver once daily.  5. Benicar HCT 20/12.5 mg daily.  6. Albuterol 2 puffs every 4 hours p.r.n.   PHYSICAL EXAMINATION:  GENERAL:  This is an obese pleasant woman who  is  in no distress.  VITAL SIGNS:  Weight 200 pounds, temperature 98.0, blood pressure  136/72, heart rate 67, SPO2 97% on room air.  HEENT:  She has a hoarse voice with some very mild strider on a forced  expiration.  LUNGS:  For the most part clear, but she does have some mild bilateral  lower lobe crackles. There is no wheezing on a forced expiration.  HEART:  Regular without murmur.  ABDOMEN:  Obese, soft, nontender with positive bowel sounds.  EXTREMITIES:  No cyanosis, clubbing, or edema.   CT scan of chest performed on 06/26/2006 is available for review. This  was performed with high resolution cuts. There were no abnormal lymph  nodes noted. Her heart size was normal. There was some old scar noted in  the right middle lobe and some tiny apical nodules of unclear  significance. Her airways were unremarkable. There were no significant  infiltrates and she did not have any evidence for interstitial lung  disease.   IMPRESSION:  1. Mixed lung disease with restriction likely due to her obesity. She      also has fixed airflow limitation which may represent fixed asthma      or more likely chronic obstructive pulmonary disease given her      prior heavy tobacco use. Her complaints relate more with upper      airway disease and upper airway symptoms. I believe that the      factors influencing include her post-nasal drip, untreated GERD,      and possibly also her Advair which could irritate the upper airway.      Her Lisinopril has already been changed to an ARB.  2. Allergic rhinitis.  3. GERD.  4. Possible coronary artery disease with exertional chest pain. This      is actively being investigated by Dr. Jens Som and has been felt to      be atypical in nature.   PLAN:  1. I will start her on Aciphex 20 mg daily to treat GERD empirically.  2. I have asked her to restart her Claritin and she will also initiate      Nasacort 2 sprays to each nostril daily.  3. She will  discontinue her Advair to see if this helps with her upper      airway irritation.  4. I have asked her to attempt to stop performing the throat clearing      maneuver as I believe that this will perpetuate her upper airway      irritation.  5. She will follow up with me in one month and we will discuss whether      she has derived any benefit from these interventions. In      particular, we will decide whether she needs to be on a standing      bronchodilator to substitute for the Advair.     Leslye Peer, MD  Electronically Signed    RSB/MedQ  DD: 07/18/2006  DT: 07/19/2006  Job #: [id number]   cc:   Madolyn Frieze. Jens Som, MD, [facility]

## 2010-05-23 NOTE — Assessment & Plan Note (Signed)
[facility]                             PULMONARY OFFICE NOTE   [name], TISDELL                       MRN:          [id number]  DATE:06/05/2006                            DOB:          [birth year]    SUBJECTIVE:  Ms. Heidi Hoffman is a 77 year old woman who follows up today  regarding her dyspnea and wheezing.  I saw her initially at the end of  April for these symptoms.  I suspected that she had some component of  COPD as well as allergic rhinitis and chronic upper airway irritation.  She returns today, having completed her pulmonary function testing and  her chest x-ray, and we are here to review those results.  She tells me  that she was unable to obtain the Singulair that we have planned to  start because of its cost and that she stopped the Claritin because she  believes it was associated with itching.  Her cough and her upper airway  irritation persist.  In particular, she feels like she has something  stuck at the back of her throat, but there is nothing there.  She  complains of some noise and possible wheezing when she walks upstairs  and when she exerts herself.  She also has shortness of breath at these  times.   CURRENT MEDICATIONS:  1. Advair 500/50 1 inhalation b.i.d.  2. AcipHex 20 mg daily.  3. Multivitamin once daily.  4. Caltrate once daily.  5. Lisinopril/HCTZ 20/25 mg daily.  6. Albuterol 2 puffs q.4h. p.r.n.   PHYSICAL EXAMINATION:  GENERAL:  This is a pleasant, overweight, well-  appearing woman who is in no distress.  VITAL SIGNS:  Her weight is 196 pounds.  Temperature 98.2, blood  pressure 130/78, heart rate 63, SpO2 97% on room air.  HEENT:  She has a hoarse voice and some nasal congestion and postnasal  drip.  LUNGS: Clear bilaterally without a wheeze on forced expiration.  HEART:  Regular without murmur.  ABDOMEN:  Obese, soft and nontender with positive bowel sounds.  EXTREMITIES:  No clubbing, cyanosis or edema.   Her pulmonary function testing was performed on Jun 05, 2006.  This  showed spirometry consistent with mixed obstructive and restrictive lung  disease.  Her lung volumes were within normal limits, which could also  be consistent with mixed disease.  Her diffusion capacity was decreased  but corrected into the normal range when adjusted for alveolar volume.   Her chest x-ray from Jun 05, 2006 showed some bibasilar interstitial  prominence without any effusions or overt infiltrates.   IMPRESSION:  Mixed lung disease secondary to probable chronic  obstructive pulmonary disease or fixed asthma and also possible  restriction due to either obesity or interstitial lung disease.  Interstitial pattern on chest x-ray raised the possibility that she  could have interstitial lung disease.   PLAN:  1. Will change her lisinopril plus HCTZ to Benicar plus HCTZ, and she      will see if this helps [place] gain control of her upper airway disease.      I do not  believe we are going to get good control until we better      manage her postnasal drip.  She needs to use the Claritin and      Singulair if at all possible.  2. I will refer her back to see Dr. Jens Som with cardiology regarding      her exertional dyspnea to insure that this is not an anginal      equivalent.  3. I will perform a high-resolution CT scan of the chest to look for      interstitial lung disease, given her interstitial prominence on      chest x-ray and her possible restriction on spirometry.  4. Ms. Hostler will return to see me in one month to review the      results.     Leslye Peer, MD  Electronically Signed    RSB/MedQ  DD: 06/11/2006  DT: 06/12/2006  Job #: [id number]   cc:   Valetta Mole. Cato Mulligan, MD  Madolyn Frieze. Jens Som, MD, [facility]

## 2010-05-23 NOTE — Assessment & Plan Note (Signed)
[facility]                             PULMONARY OFFICE NOTE   Heidi Hoffman, Heidi Hoffman                       MRN:          [id number]  DATE:10/03/2006                            DOB:          December 11, 1932    SUBJECTIVE:  [name] is a 77 year old woman, who has a remote  history of tobacco use and follows up today for her chronic cough.  She  tells me that since her last visit she has had upper airway noise and  wheezing off and on.  She coughs frequently and produces whitish  phlegm.  Occasionally she has paroxysms of cough that cause her to  choke.  This happens particularly at night.  She has had associated  shortness of breath.  She had been using Claritin and Nasacort for  allergic rhinitis.  She continues on the Claritin, but had to stop the  Nasacort when the sample ran out.  Her cough did not really change on  this medication.  She has not been able to use her AcipHex because this  was rejected by her insurance.  I had originally started her on AcipHex  20 mg b.i.d.  Her cough has worsened to some degree since the AcipHex  was discontinued.   CURRENT MEDICATIONS:  1. Multivitamin once daily.  2. Caltrate once daily.  3. Benicar/HCTZ 20/12.5 mg daily.  4. Claritin 10 mg daily.  5. Albuterol 2 puffs q. 4 hours p.r.n.   EXAM:  GENERAL:  This is an overweight, pleasant, African-American woman  in no distress.  Weight 202 pounds, up 3 pounds from our last visit.  Temperature 98,  blood pressure 140/70, heart rate 73, spO2 95% on room air.  HEENT EXAM:  She has a very hoarse, scratchy voice.  NECK:  Supple without lymphadenopathy.  She does have some stridor  throughout expiration and through part of inspiration.  LUNGS:  Clear with the exception of some transmitted upper airway  sounds.  She has good air movement.  HEART:  Regular without murmur.  ABDOMEN:  Benign.  EXTREMITIES:  No cyanosis, clubbing or edema.   IMPRESSION:  1. Mixed lung  disease with mild restriction and mild air flow      limitation with likely components of COPD.  2. Upper airway irritation and chronic cough that I believe is the      primary driving force behind her dyspnea.   Her cough has been poorly controlled, although we have not adequately  addressed both her GERD and her postnasal drip.  I will continue her on  Claritin, restart her Nasacort two sprays each nostril daily and start  her on omeprazole 20 mg b.i.d.  Given the chronic nature of her cough  and hoarse voice, I would also like to schedule fiberoptic bronchoscopy  to rule out any structural abnormality that may be a contributor.  We  will perform this on October 2 at 11 a.m.  I  will follow up with her in six weeks to review the results of her  bronchoscopy and see how she is doing on the new medical  regimen.     Leslye Peer, MD  Electronically Signed    RSB/MedQ  DD: 10/04/2006  DT: 10/05/2006  Job #: [id number]   cc:   Madolyn Frieze. Jens Som, MD, [facility]

## 2010-05-23 NOTE — Assessment & Plan Note (Signed)
[facility]                             PULMONARY OFFICE NOTE   Heidi Hoffman, Heidi Hoffman                       MRN:          [id number]  DATE:10/23/2006                            DOB:          Jun 01, 1932    CHIEF COMPLAINT:  Acute hemoptysis for two days since October 21, 2006  now resolved.   PROBLEM LIST:  1. Chronic cough.  2. Rhinitis.  3. Vocal cord dysfunction.  4. Obesity.  5. Acid reflux disease.  6. Post nasal drip.  7. Previous smoking status.  8. Chronic bronchitis per history.   The patient is an obese 77 year old female who is a patient of Dr.  Delton Coombes.  She underwent bronchoscopy on October 10, 2006.  The bronchoscopy  was an airway exam that confirmed the presence of vocal cord dysfunction  and right upper lobe lavage that just showed chronic inflammatory cells  and few Klebsiella pneumonia but no white cells and no Gram stain  positivity.  After the bronchoscopy she was fine, but two days ago on  October 21, 2006 which is 11 days after the bronchoscopy she had streaks  of hemoptysis in her regular phlegm.  She discussed this hemoptysis  not a lot, but because it is the first time she had hemoptysis she got  scared.  Along with the cough she started experiencing left neck pain.  Currently the hemoptysis is resolved for the last 24 hours, and she is  feeling fine.  Interestingly, she said that overall her baseline sputum  production has decreased.   She denies fever, runny nose, edema.   PAST MEDICAL HISTORY:  As in problem list.   PHYSICAL EXAMINATION:  VITAL SIGNS:  Weight 200 pounds, temperature  98.2, blood pressure 132/66, pulse 75, saturation 98% on room air.  GENERAL:  Looks comfortable seated in the exam room, periodically has  dry cough.  CENTRAL NERVOUS SYSTEM:  Alert and oriented x3, speech normal.  HEENT:  Normal.  RESPIRATORY:  Clear.  No vocal crackles or wheeze.  CARDIOVASCULAR:  Normal heart sounds, no murmurs.  ABDOMEN:  Obese, soft, nontender.  EXTREMITIES:  No edema.   LABORATORY DATA:  CT scan of the chest June 24, 2006 was normal.   PFT's done on October 23, 2006 show FEV1 1.3 L, 63%, FVC 2.05 L, 80%,  FEV1/FVC ratio that was reduced at 63.  DLCO  12.2 mL/mmHg per minute,  52% predicted.  In essence the PFT was consistent with moderate both  stage 3 COPD and obstruction.  In addition there was flattening of the  flow volume loop.   Bronchoscopy on October 10, 2006 per Dr. Delton Coombes shows vocal cord  dysfunction.  Rest of the labs as in HPI.   ASSESSMENT/PLAN:  1. Hemoptysis.  This resolved probably relative to tracheobronchitis      from severe coughing.  No evidence of acute post bronchoscopy      pneumonia.  I have reassured the patient, but she will continue to      watch for symptoms.  2. Klebsiella pneumoniae in bronchial lavage.  Only a few organisms  are seen.  CT scan on June 2008 was normal.  No symptoms of      pneumonia prebronchoscopy or post bronchoscopy and no wbc's in the      bronchoscopy lavage itself.  Therefore reassured the patient, but      will continue to monitor her expectantly.  3. Vocal cord dysfunction.  I have referred her to speech therapy.  4. Health maintenance.  I have given flu shot today.   My evaluation was discussed with Dr. Delton Coombes who agrees with my plan and  helped support inputting to this plan.     Kalman Shan, MD  Electronically Signed    MR/MedQ  DD: 10/23/2006  DT: 10/24/2006  Job #: [id number]

## 2010-05-26 NOTE — Cardiovascular Report (Signed)
[place]  Patient:    Heidi Hoffman, Heidi Hoffman                       MRN: [id number] Proc. Date: 09/15/99 Adm. Date:  [id number] Attending:  Junious Silk CC:         CV Laboratory  Madolyn Frieze. Jens Som, M.D. [facility]  Danice Goltz, M.D.   Cardiac Catheterization  INDICATIONS:  Ms. Dam is a 77 year old African-American female who moved recently from [place].  She has a history of asthma.  She stopped smoking several weeks ago.  She was seen in the emergency room with an exacerbation of her asthma.  After this, she was placed on prednisone and bronchodilators. She returned the next day with chest pain.  She was seen by Dr. Jens Som and subsequently referred for further evaluation including cardiac catheterization.  There was a borderline anemia.  The white count was elevated probably due to steroid administration.  The CK totals were 197, 234, and 260, but MBs never rose about 4.2.  She was brought to the catheterization lab for further evaluation.  PROCEDURES: 1. Left heart catheterization. 2. Selective coronary arteriography. 3. Selective left ventriculography.  DESCRIPTION OF PROCEDURE:  The procedure was performed from the right femoral artery using 6 French catheters.  She tolerated the procedure without complication.  HEMODYNAMICS:  The central aortic pressure was 138/72, LV pressure 141/24. There was no gradient on pullback across the aortic valve.  ANGIOGRAPHIC DATA:  LEFT VENTRICULOGRAPHY:  Ventriculography in the RAO projection reveals vigorous global systolic function.  Ejection fraction was calculated at 74%. There was no significant mitral regurgitation noted.  CORONARY ARTERIOGRAPHY:  The left main coronary artery is free of critical disease.  The left anterior descending artery courses to the apex.  There are two diagonal branches and several septal perforators.  The LAD appears smooth and without significant focal  narrowing.  The circumflex provides the first marginal and a fairly large third marginal with a very small second marginal.  The circumflex system is without significant disease.  The right coronary artery is large tortuous vessel.  There is a posterior descending vessel which bifurcates, a small posterolateral branch and a second larger posterolateral branch.  This vessel likewise is free of critical disease.  CONCLUSIONS: 1. Normal left ventricular function. 2. No critical coronary artery disease.  DISPOSITION:  I have talked with Dr. Jayme Cloud and we will get a pulmonary consult to help with further management of her asthma.  In addition, she does have some mild epigastric discomfort and with this we will get a GI consult. The patient will need a long-term primary care physician and her son has already urged her to do this. DD:  09/15/99 TD:  09/16/99 Job: [id number]

## 2010-05-26 NOTE — Assessment & Plan Note (Signed)
[facility]                             PULMONARY OFFICE NOTE   ASHNI, LONZO                       MRN:          [id number]  DATE:05/01/2006                            DOB:          1932/04/17    REASON FOR CONSULTATION:  I was asked by Dr. Birdie Sons to evaluate  Ms. Krupp for asthma and worsening dyspnea.   BRIEF HISTORY:  Ms. Scheller is a pleasant 77 year old woman with history  of prior tobacco use and longstanding asthma. She is referred for slowly  progressive worsening dyspnea. She tells me that she originally had  asthma as a child, it resolved but then started to bother her again  around age 73. She had been doing well on a stable regimen of Advair  500/50 b.i.d. and a proton pump inhibitor. Her medications were changed  a few years ago although she is not sure what medication were initiated.  She knows that she previously had been on Singulair but that this was  stopped. She believes that she began to have worsening exertional  shortness of breath beginning over the last several months. This has  been associated with cough and wheezing that has been audible to herself  and to others around her. She also has had some chest tightness and  discomfort. She has used albuterol with relief. She is able to walk  about 75 feet and then she has to stop due to shortness of breath. She  also tells me that for the last month she keeps a cold and has a lot  of nasal drip and nasal congestion. She is on  lisinopril/hydrochlorothiazide which was started about 1 year ago.  Otherwise she complains of some hoarse voice, nasal congestion,  headache, indigestion, acid heartburn.   PAST MEDICAL HISTORY:  1. Hypertension.  2. Asthma as detailed above.  3. Hypercholesterolemia.  4. Allergic rhinitis.  5. History of tobacco abuse.   ALLERGIES:  PROTONIX CAUSES ITCHING.   CURRENT MEDICATIONS:  1. Advair 500/50 one inhalation b.i.d.  2. AcipHex  20 mg daily.  3. Multivitamin once daily.  4. Caltrate once daily.  5. Lisinopril/hydrochlorothiazide 20/25 mg daily.  6. Albuterol 2 puffs q.4 hours p.r.n. for shortness of breath.   SOCIAL HISTORY:  The patient has approximately a 50-pack-year total  history. She quit 3 years ago. She is widowed and lives alone. She is  retired and tells me that she just moved to a new apartment. She  formerly lived with her son for about 1 year. She was exposed to a dog  in that environment. She uses occasional alcohol.   FAMILY HISTORY:  Significant for allergic rhinitis, asthma, and coronary  artery disease.   PHYSICAL EXAMINATION:  IN GENERAL: This is a pleasant elderly woman. The  weight is 195 pounds, temperature 97.5, blood pressure 132/70, heart  rate 69, SPO2 98% on room air.  HEENT EXAM: She has a hoarse voice with evidence of postnasal drip and  posterior pharyngeal erythema.  LUNGS: Clear to auscultation bilaterally.  HEART: Regular rate and rhythm without murmur.  ABDOMEN: Benign.  EXTREMITIES: Have no cyanosis, clubbing, or edema.  NEUROLOGIC: She has a nonfocal exam.   Pulmonary function tests are available for review from October of 2001.  These showed evidence of airflow limitation without any bronchodilator  responsiveness. She was unable to perform lung volumes. Her DLCO was  decreased but corrected into the normal range when I adjusted for  alveolar volume.   IMPRESSION:  1. Chronic obstructive pulmonary disease superimposed on probable      fixed asthma.  2. Allergic rhinitis.  3. Possible impact of her ACE inhibitor on her hoarse voice and her      airflow limitation.   PLAN:  1. I will restart her Singulair 10 mg daily.  2. We will start loratadine 10 mg daily.  3. She will continue her Advair 500/50 one inhalation b.i.d.  4. She will use albuterol p.r.n.  5. I will order pulmonary function testing to compare to her prior.  6. I will consider referral back to  cardiology and Dr. Jens Som if her      chest pain continues after the therapy outlined above.  7. I will follow up with Ms. Krakowski in 1 month to assess our progress      and review her pulmonary function testing.     Leslye Peer, MD  Electronically Signed    RSB/MedQ  DD: 05/21/2006  DT: 05/21/2006  Job #: [id number]   cc:   Valetta Mole. Cato Mulligan, MD  Madolyn Frieze. Jens Som, MD, [facility]

## 2010-05-26 NOTE — H&P (Signed)
NAMEALEXZANDRA, Heidi Hoffman                          ACCOUNT NO.:  [id number]   MEDICAL RECORD NO.:  [id number]                   PATIENT TYPE:  EMS   LOCATION:  ED                                   FACILITY:  [facility]   PHYSICIAN:  Corwin Levins, M.D. [facility]             DATE OF BIRTH:  [birth year]   DATE OF ADMISSION:  04/17/2003  DATE OF DISCHARGE:                                HISTORY & PHYSICAL   CHIEF COMPLAINT:  Increasing shortness of breath for the past 5 days not  amenable to outpatient treatment.   HISTORY OF PRESENT ILLNESS:  Heidi Hoffman is a 77 year old black female with  a history of asthma and several exacerbations in the past a few requiring  hospitalizations here who seemed to have a low-grade temperature and  nonproductive cough increase that started on 5 days ago.  She has some chest  discomfort with cough but none otherwise, symptoms of choking sensation of a  minor nature but no nausea, vomiting, diarrhea, or other problems.  She was  able to see Dr. Cato Hoffman as an outpatient Friday, April 8th, apparently had a  steroid shot IM and given a prescription for prednisone which she has not  been able to get the prednisone filled yet.  She also had several treatments  in the office, felt better, but by the end of the day symptoms recurred.  She came to the emergency room late last evening and again was somewhat  better symptomatically here in the ER but just not good enough to go home.  O2 saturations have been well but she shows some marked dyspnea with  ambulation, lives by herself and seems unable to function adequately enough  to be discharged from the ER.   PAST MEDICAL HISTORY:  Illnesses:  1. Asthma/COPD.  2. Hypertension.  3. Hypercholesterolemia.  4. Obesity.  5. GERD apparently diet controlled.  6. Heart catheterization 2001 with noncritical coronary artery disease.   Surgeries:  None.   ALLERGIES:  No known drug allergies..   CURRENT MEDICATIONS:  1. Albuterol  metered-dose inhaler.  2. Advair 500/50 b.i.d.  3. Blood pressure pills she is not really taking but she really does not     know the name of either.   SOCIAL HISTORY:  No tobacco for about 3 years.  Alcohol occasional.  Currently widowed, lives alone in [place] although she has a son in  [place].   FAMILY HISTORY:  Asthma and diabetes.   REVIEW OF SYSTEMS:  Otherwise noncontributory.   PHYSICAL EXAMINATION:  GENERAL:  Heidi Hoffman is very nice, alert and  oriented x3.  VITAL SIGNS:  Blood pressure 150/110, respirations 24, heart rate 86.  O2  saturation 98%.  She is afebrile.  EENT:  Sclerae clear.  TMs with bilateral erythema, oropharynx with marked  erythema.  NECK:  Without JVD or thyromegaly.  CHEST:  Increased breath sounds bilaterally with diffuse rhonchi and  several  wheezes.  CARDIAC:  Regular rate and rhythm.  ABDOMEN:  Soft, nontender, positive bowel sounds.  EXTREMITIES:  No edema.   LABORATORY DATA:  Same as in the ER.   ASSESSMENT AND PLAN:  1. Asthma exacerbation, possible viral bronchitic versus other __________.     She is not better with the usual outpatient treatment and as she has     marked low function, she is being admitted.  Will check chest x-ray and     routine labs, and she will also be given IV antibiotics, steroids,     oxygen, and nebulizer treatment and hopefully increase activity as     tolerated.  2. Hypertension--otherwise mildly elevated at this point.  Might be     secondary to acute illness.  It is unclear what medication she was taking     as an outpatient.  Will simply watch for the moment and she says her     blood pressure is normally borderline at home anyway and she does not     often take her medications.  If she appears __________ hypertension in     the hospital, so need to be started on medications although over the     weekend it may be difficult to contact Dr. Cato Hoffman' office to find out     what she was on.                                                Corwin Levins, M.D. [facility]    JWJ/MEDQ  D:  04/17/2003  T:  04/17/2003  Job:  [id number]   cc:   Valetta Mole. Swords, M.D. [facility]

## 2010-05-26 NOTE — Discharge Summary (Signed)
Heidi Hoffman, MORNING                          ACCOUNT NO.:  [id number]   MEDICAL RECORD NO.:  [id number]                   PATIENT TYPE:  INP   LOCATION:  0358                                 FACILITY:  [facility]   PHYSICIAN:  Bruce Rexene Edison. Swords, M.D. [facility]           DATE OF BIRTH:  10-05-1932   DATE OF ADMISSION:  04/17/2003  DATE OF DISCHARGE:  04/22/2003                                 DISCHARGE SUMMARY   DISCHARGE DIAGNOSES:  1. Asthma exacerbation versus asthmatic bronchitis versus chronic     obstructive pulmonary disease exacerbation.  2. Hypertension.  3. Chest pain with abnormal CK and MB (cardiology suggest noncardiac     source).  4. Possible hyperlipidemia.  5. Hyperglycemia (likely steroid induced).  6. Noncritical coronary artery disease 2001 by angiography.  7. Obesity.  8. Gastroesophageal reflux disease.  9. Previous tobacco abuse.   DISCHARGE MEDICATIONS:  1. Lisinopril 10 mg p.o. q.d.  2. Prednisone 40 mg, 20 mg, 10 mg, 5 mg each for 3 days.  3. __________ 250 mg p.o. q.d. to be completed on April 23, 2003.  4. Albuterol 2.5 mg nebulized 4 times daily.   RESTRICTIONS:  She should be maintained on a diabetic diet.   FOLLOW UP:  Dr. Cato Mulligan she already has scheduled on May 03, 2003.   CONDITION ON DISCHARGE:  Improved, no shortness of breath or dyspnea on  exertion.   HOSPITAL PROCEDURE:  Chest x-ray April 17, 2003 demonstrated chronic  interstitial change without focal consolidation or edema.   LABORATORY DATA:  Lipid profile, cholesterol 190, triglycerides 77, HDL 45,  LDL 130.  Cardiac enzymes, CK and CK-MB were abnormal, highest CK 911,  highest CK-MB 18.4, highest relative index 2.1, troponin I's were normal at  0.01 and 0.02.  TSH normal at 0.703. CBC on admission with a white count of  8.1, hemoglobin 12.0, platelet count 320,000.   HOSPITAL COURSE:  #1.  The patient admitted to the hospital service on April 17, 2003.  The patient was admitted with  exacerbation of asthma/asthmatic  bronchitis. She was treated aggressively with IV steroids, nebulizers,  oxygen, nebulizers.  She was slow to improve but at the time of discharge  (April 22, 2003) the patient was doing well.  She had no dyspnea on  exertion.  She continued to have some wheezing but it was much improved and  her symptoms had resolved.   #2.  CARDIAC:  The patient reported dyspnea on exertion, had abnormal CK's  with abnormal CK-MB.  Cardiology consult was obtained.  Dr. Dietrich Pates and Dr.  Myrtis Ser both felt that elevation in CK was not cardiac.  They did suggest  outpatient Cardiolite, specifically Dr. Myrtis Ser recommended a dobutamine  Cardiolite.   #3.  HYPERTENSION.  The patient was started on lisinopril on admission to  the hospital and she will be continued on that.   #4.  PREVIOUS TOBACCO ABUSE.  She  is not using any tobacco currently and has  not for the past 3-4 years.                                               Bruce Rexene Edison Swords, M.D. [facility]    BHS/MEDQ  D:  04/22/2003  T:  04/22/2003  Job:  [id number]

## 2010-05-26 NOTE — Discharge Summary (Signed)
[facility]  Patient:    Heidi Hoffman                       MRN: [id number] Adm. Date:  [id number] Disc. Date: 09/16/99 Attending:  Junious Silk Dictator:   Lavella Hammock, P.A. CC:         Venita Lick. Pleas Koch., M.D. [facility]  Danice Goltz, M.D.   Referring Physician Discharge [place]  DATE OF BIRTH:  Nov 04, 1932.  PROCEDURES 1. Cardiac catheterization. 2. Coronary arteriogram. 3. Left ventriculogram. 4. CT of the chest with contrast.  HOSPITAL COURSE:  Heidi Hoffman is a 77 year old female with no known history of coronary artery disease, who had a treadmill in [place] three years ago and was told she has heart disease but had no more specific information, and she developed chest pain on the day of admission that was associated with exertion and became presyncopal.  The pain and other symptoms were relieved by rest and she had minor episodes of pain in the emergency room but was pain free by the time of evaluation.  She had an asthma attack and had been seen in the [facility] Emergency Room on September 13, 1999, where she was placed on steroids and antibiotics.  She described several episodes of exertional chest pain and she was admitted to rule out MI and for further evaluation.  Her risk factors include tobacco use, hyperlipidemia and obesity; she also has a history of asthma but no other significant medical history. Her enzymes were negative for MI and her white count was elevated at 20,600 but it was felt that this was secondary to the steroid dose.  Her INR was 1.4. She had a cardiac catheterization scheduled for September 15, 1999 and this was done.  It showed no critical coronary artery disease and an ejection fraction of 74%.  A pulmonary consult was called for management with asthma and a GI evaluation was called because of anemia and possible GI etiology of symptoms. She was seen by Dr. Judie Petit T. Pleas Koch. and  he recommended Protonix daily and also felt that any further evaluation could be performed as an outpatient but did recommend an EGD and a colonoscopy; he also stated that she needed a primary M.D.  She was seen by Dr. Danice Goltz of the pulmonary service and Dr. Jayme Cloud recommended bronchodilators as well as antibiotics, which she felt should be Tequin, for better coverage with patient with a history of tobacco use; she also felt that PE should be ruled out and that if it were ruled out, any further pulmonary evaluation could be done as an outpatient. She recommended that steroids be tapered over the next few days to a week and also started that she needed smoking cessation and a primary care M.D.  By September 16, 1999, she had had a CT that was negative for PE and she also had an abdominal ultrasound that was negative.  With a negative abdominal ultrasound and a negative chest CT and a negative catheterization, she was considered stable for discharge on p.m. of September 16, 1999.  DISCHARGE CONDITION:  Improved.  CONSULTANTS 1. Dr. Judie Petit T. Pleas Koch. with  GI. 2. Dr. Danice Goltz with pulmonary critical care.  DISCHARGE DIAGNOSES 1. Chest pain, possibly pulmonary or gastrointestinal etiology. 2. History of asthma/chronic obstructive pulmonary disease. 3. History of long-term tobacco use. 4. History of hyperlipidemia. 5. History of obesity. 6. Family history of coronary artery disease.  7. Leukocytosis probably secondary to steroids. 8. Obesity. 9. Preserved left ventricular function.  DISCHARGE INSTRUCTIONS 1. Her activity level is to include no driving and no sexual or strenuous    activity for two days. 2. She is to stick to a low-salt and low-fat diet. 3. She is to call the office for bleeding, swelling or drainage at the cath    site. 4. She is to continue tobacco cessation, which she stated began three weeks    ago. 5. She is to follow up with Dr. Russella Dar  and Dr. Madolyn Frieze. [place] and with the    pulmonary physicians and call for appointments. 6. She is also to obtain a primary M.D.  DISCHARGE MEDICATIONS 1. Tequin 400 mg daily for 10 days. 2. Prednisone taper over eight days. 3. Advair Diskus b.i.d. 4. Ventolin MDI 90 mcg two puffs as needed. 5. Protonix 40 mg q.d. 6. Mylanta p.r.n.  SPECIAL INSTRUCTIONS:  She is not to take Zithromax. DD:  09/16/99 TD:  09/17/99 Job: [id number]

## 2010-05-26 NOTE — Consult Note (Signed)
Heidi Hoffman, Heidi Hoffman                          ACCOUNT NO.:  [id number]   MEDICAL RECORD NO.:  [id number]                   PATIENT TYPE:  INP   LOCATION:  0358                                 FACILITY:  [facility]   PHYSICIAN:  Chidester Bing, M.D.               DATE OF BIRTH:  January 16, 1932   DATE OF CONSULTATION:  04/19/2003  DATE OF DISCHARGE:                                   CONSULTATION   PRIMARY CARDIOLOGIST:  Olga Millers, M.D. [facility]   HISTORY OF PRESENT ILLNESS:  A 77 year old woman referred for evaluation of  chest discomfort. [name] has no known cardiovascular disease. She  underwent coronary angiography in 2001 for unclear reasons and was found to  have no significant obstructive disease. She does have multiple  cardiovascular risk factors including hypertension, hyperlipidemia, and  substantial prior tobacco use. She is now hospitalized for COPD exacerbation  due to asthmatic bronchitis. Initial treatment with intravenous antibiotics,  Solu-Medrol, and handheld nebulizers have resulted in some improvement, but  significant bronchospasm persists. Initially, CPK was elevated to nearly  1000, but CPK-MB and troponin were unremarkable.   On review of her medical status, the patient reported exertional chest  discomfort. She experiences sharp substernal discomfort when she walks up a  hill. When walking flat, this generally is not an issue--there, she is more  limited by exertional dyspnea. Exercise tolerance is quite limited. Her  symptoms generally pass within seconds to minutes when she rests. Some  times, there is radiation to her shoulders. There is generally associated  dyspnea. She has had no diaphoresis or nausea. She some times has similar  symptoms with eating, drinking, or coughing.   PAST MEDICAL HISTORY:  Otherwise noted above for GERD and obesity. She has  never had surgery. She discontinued cigarette smoking six months ago.   She has no known allergies.   MEDICATIONS:  On admission include Advair, albuterol, and antihypertensives.   SOCIAL HISTORY:  Widowed; lives in [place]; retired from clerical work in  a hospital. She denies excessive use of alcohol.   FAMILY HISTORY:  Relatively unremarkable. Her father did suffer a fatal  myocardial infarction, but at age 75.   REVIEW OF SYSTEMS:  Notable for intermittent fevers, sweats, heartburn.  Other systems are reviewed and are negative.   PHYSICAL EXAMINATION:  GENERAL: A pleasant, boisterous woman with a raspy  voice and in no acute distress.  VITAL SIGNS: Temperature 97, heart rate 85 and regular, respirations 20,  blood pressure 140/70.  HEENT: Anicteric sclerae.  NECK: No jugular venous distention; normal carotid upstrokes without bruits.  ENDOCRINE: No thyromegaly.  HEMATOPOIETIC: No adenopathy.  SKIN: No significant lesions.  LUNGS: Moderate expiratory wheeze; prolonged expiratory phase; no rales.  CARDIAC: Normal first and second heart sounds; fourth heart sound present.  ABDOMEN: Soft and nontender; normal bowel sounds; no bruits; no  organomegaly.  EXTREMITIES: No edema; distal pulses intact.  MUSCULOSKELETAL:  No joint deformities.  NEUROMUSCULAR: Symmetric strength and tone.   Chest x-ray: Chronic interstitial changes; no acute abnormalities.   EKG: Normal sinus rhythm; nondiagnostic inferior Q waves; left atrial  abnormality; probable LVH; minor nonspecific T-wave abnormality.   Other laboratory notable for normal renal function, white count of 19,900,  hematocrit 36.   IMPRESSION:  Heidi Hoffman has symptoms that are compatible with exertional  angina, but the threshold is rather variable. Relief has always been prompt  with rest. This is chronic stable angina at worse. In the setting of an  ongoing exacerbation of COPD, I do not think that this is the ideal time to  perform cardiac testing, which can be deferred until after she is discharged  from the hospital.  Aspirin could be added to her medical regime. She is not  obviously a candidate for beta blocker. She needs no anticoagulation beyond  DVT prophylaxis. Blood pressure control appears adequate. Lipids will be  assessed. We will be happy to make arrangements for further cardiac  assessment and treatment following hospital discharge.                                               [place] Bing, M.D.    RR/MEDQ  D:  04/19/2003  T:  04/19/2003  Job:  [id number]

## 2010-06-19 ENCOUNTER — Encounter: Payer: Self-pay | Admitting: Internal Medicine

## 2010-10-19 LAB — AFB CULTURE WITH SMEAR (NOT AT ARMC)

## 2010-10-19 LAB — CULTURE, RESPIRATORY W GRAM STAIN

## 2011-05-03 DIAGNOSIS — R079 Chest pain, unspecified: Secondary | ICD-10-CM | POA: Diagnosis not present

## 2011-05-03 DIAGNOSIS — R109 Unspecified abdominal pain: Secondary | ICD-10-CM | POA: Diagnosis not present

## 2011-05-03 DIAGNOSIS — M5137 Other intervertebral disc degeneration, lumbosacral region: Secondary | ICD-10-CM | POA: Diagnosis not present

## 2011-05-03 DIAGNOSIS — R933 Abnormal findings on diagnostic imaging of other parts of digestive tract: Secondary | ICD-10-CM | POA: Diagnosis not present

## 2011-05-03 DIAGNOSIS — N390 Urinary tract infection, site not specified: Secondary | ICD-10-CM | POA: Diagnosis not present

## 2011-05-03 DIAGNOSIS — J45909 Unspecified asthma, uncomplicated: Secondary | ICD-10-CM | POA: Diagnosis not present

## 2011-05-03 DIAGNOSIS — D259 Leiomyoma of uterus, unspecified: Secondary | ICD-10-CM | POA: Diagnosis not present

## 2011-05-03 DIAGNOSIS — R9431 Abnormal electrocardiogram [ECG] [EKG]: Secondary | ICD-10-CM | POA: Diagnosis not present

## 2011-05-03 DIAGNOSIS — M549 Dorsalgia, unspecified: Secondary | ICD-10-CM | POA: Diagnosis not present

## 2011-05-10 ENCOUNTER — Ambulatory Visit (INDEPENDENT_AMBULATORY_CARE_PROVIDER_SITE_OTHER): Payer: Medicare Other | Admitting: Internal Medicine

## 2011-05-10 ENCOUNTER — Encounter: Payer: Self-pay | Admitting: Internal Medicine

## 2011-05-10 VITALS — BP 172/84 | Temp 98.7°F | Ht 67.0 in | Wt 199.0 lb

## 2011-05-10 DIAGNOSIS — Z23 Encounter for immunization: Secondary | ICD-10-CM

## 2011-05-10 DIAGNOSIS — I10 Essential (primary) hypertension: Secondary | ICD-10-CM

## 2011-05-10 DIAGNOSIS — R945 Abnormal results of liver function studies: Secondary | ICD-10-CM

## 2011-05-10 DIAGNOSIS — R932 Abnormal findings on diagnostic imaging of liver and biliary tract: Secondary | ICD-10-CM

## 2011-05-10 MED ORDER — LOSARTAN POTASSIUM-HCTZ 100-25 MG PO TABS: 1.0000 | ORAL_TABLET | Freq: Every day | ORAL | Status: DC

## 2011-05-10 NOTE — Assessment & Plan Note (Signed)
She needs to resume medicatons---refilled meds

## 2011-05-10 NOTE — Progress Notes (Signed)
Patient ID: Heidi Hoffman, female   DOB: [birth year], 78 y.o.   MRN: [id number] Heidi Hoffman to [facility] ED 05/03/11-- went with vague complaints and had numerous tests perfromed.. CT scan showed subcapsular nodules-Left hepatic lobe.   htn--she has been out of medications  Left leg/hip-- 1 week hx of pain when she "turns the wrong way"  Past Medical History  Diagnosis Date  . Asthma   . GERD (gastroesophageal reflux disease)   . Hyperlipidemia   . Hypertension   . Osteoporosis   . COPD (chronic obstructive pulmonary disease)   . Allergy   . Anemia   . Diverticulosis   . Tubular adenoma of colon 10/01    History   Social History  . Marital Status: Single    Spouse Name: N/A    Number of Children: N/A  . Years of Education: N/A   Occupational History  . Not on file.   Social History Main Topics  . Smoking status: Former Smoker    Quit date: 05/09/2000  . Smokeless tobacco: Not on file  . Alcohol Use: Yes  . Drug Use: No  . Sexually Active: Not on file   Other Topics Concern  . Not on file   Social History Narrative  . No narrative on file    Past Surgical History  Procedure Date  . Cardiac catheterization 2006    Family History  Problem Relation Age of Onset  . Heart disease Mother   . Diabetes Sister   . Asthma Daughter   . Asthma Son     Allergies  Allergen Reactions  . Pantoprazole Sodium     REACTION: itching    Current Outpatient Prescriptions on File Prior to Visit  Medication Sig Dispense Refill  . albuterol (PROVENTIL HFA;VENTOLIN HFA) 108 (90 BASE) MCG/ACT inhaler Inhale 2 puffs into the lungs every 6 (six) hours as needed.      . Calcium Carbonate-Vit D-Min (CALTRATE 600+D PLUS) 600-400 MG-UNIT per tablet Take 1 tablet by mouth daily.      . furosemide (LASIX) 20 MG tablet Take 20 mg by mouth daily.      Marland Kitchen losartan-hydrochlorothiazide (HYZAAR) 100-25 MG per tablet Take 1 tablet by mouth daily.         patient denies chest pain, shortness of  breath, orthopnea. Denies lower extremity edema, abdominal pain, change in appetite, change in bowel movements. Patient denies rashes, musculoskeletal complaints. No other specific complaints in a complete review of systems.   BP 172/84  Temp(Src) 98.7 F (37.1 C) (Oral)  Ht 5\' 7"  (1.702 m)  Wt 199 lb (90.266 kg)  BMI 31.17 kg/m2  overweight female in no acute distress. HEENT exam atraumatic, normocephalic, extraocular muscles are intact. Neck is supple. No jugular venous distention no thyromegaly. Chest clear to auscultation without increased work of breathing. Cardiac exam S1 and S2 are regular. Abdominal exam active bowel sounds, soft, nontender. Extremities no edema. Neurologic exam she is alert without any motor sensory deficits. Gait is normal. FROM both hips

## 2011-05-14 ENCOUNTER — Ambulatory Visit
Admission: RE | Admit: 2011-05-14 | Discharge: 2011-05-14 | Disposition: A | Discharge status: Home / Self Care | Payer: Medicare Other | Source: Ambulatory Visit | Attending: Internal Medicine | Admitting: Internal Medicine

## 2011-05-14 DIAGNOSIS — K7689 Other specified diseases of liver: Secondary | ICD-10-CM | POA: Diagnosis not present

## 2011-05-14 DIAGNOSIS — R932 Abnormal findings on diagnostic imaging of liver and biliary tract: Secondary | ICD-10-CM

## 2011-05-15 ENCOUNTER — Other Ambulatory Visit: Payer: Self-pay | Admitting: Internal Medicine

## 2011-05-15 DIAGNOSIS — M8580 Other specified disorders of bone density and structure, unspecified site: Secondary | ICD-10-CM

## 2011-05-25 ENCOUNTER — Ambulatory Visit: Payer: Self-pay | Admitting: Internal Medicine

## 2011-05-28 ENCOUNTER — Ambulatory Visit (INDEPENDENT_AMBULATORY_CARE_PROVIDER_SITE_OTHER)
Admission: RE | Admit: 2011-05-28 | Discharge: 2011-05-28 | Disposition: A | Discharge status: Home / Self Care | Payer: Medicare Other | Source: Ambulatory Visit

## 2011-05-28 DIAGNOSIS — M8580 Other specified disorders of bone density and structure, unspecified site: Secondary | ICD-10-CM

## 2011-05-28 DIAGNOSIS — M949 Disorder of cartilage, unspecified: Secondary | ICD-10-CM

## 2011-05-28 DIAGNOSIS — M899 Disorder of bone, unspecified: Secondary | ICD-10-CM | POA: Diagnosis not present

## 2011-06-06 ENCOUNTER — Telehealth: Payer: Self-pay | Admitting: Family Medicine

## 2011-06-06 ENCOUNTER — Encounter: Payer: Self-pay | Admitting: Internal Medicine

## 2011-06-06 DIAGNOSIS — M79605 Pain in left leg: Secondary | ICD-10-CM

## 2011-06-06 NOTE — Telephone Encounter (Signed)
Pulled from Triage vmail - pt called at 12:47. Wants a call about her test results form last week. Did not specify on the tests. Thanks.

## 2011-06-06 NOTE — Telephone Encounter (Signed)
Pt called req results from Bone Density Test. Pls call.

## 2011-06-07 NOTE — Telephone Encounter (Signed)
Ok to schedule.

## 2011-06-07 NOTE — Telephone Encounter (Signed)
Dr Cato Mulligan, she already had the bone density she is wanting the results

## 2011-06-08 NOTE — Telephone Encounter (Signed)
Pt called req status of getting results from Bone Density test. Pls call back today. Pt is still having problems with her lft leg. Pt is limping.

## 2011-06-08 NOTE — Telephone Encounter (Signed)
Pt aware, order placed 

## 2011-06-08 NOTE — Telephone Encounter (Signed)
Bone Density report not back yet.  Per Dr Cato Mulligan refer to an orthopedist.  Her leg has nothing to do with the bone density test

## 2011-06-18 DIAGNOSIS — Z1231 Encounter for screening mammogram for malignant neoplasm of breast: Secondary | ICD-10-CM | POA: Diagnosis not present

## 2011-06-25 DIAGNOSIS — M76899 Other specified enthesopathies of unspecified lower limb, excluding foot: Secondary | ICD-10-CM | POA: Diagnosis not present

## 2011-07-17 DIAGNOSIS — M169 Osteoarthritis of hip, unspecified: Secondary | ICD-10-CM | POA: Diagnosis not present

## 2011-07-17 DIAGNOSIS — M5126 Other intervertebral disc displacement, lumbar region: Secondary | ICD-10-CM | POA: Diagnosis not present

## 2011-07-20 ENCOUNTER — Encounter: Payer: Self-pay | Admitting: Internal Medicine

## 2011-07-21 DIAGNOSIS — M169 Osteoarthritis of hip, unspecified: Secondary | ICD-10-CM | POA: Diagnosis not present

## 2011-07-30 DIAGNOSIS — M169 Osteoarthritis of hip, unspecified: Secondary | ICD-10-CM | POA: Diagnosis not present

## 2011-08-16 DIAGNOSIS — M25559 Pain in unspecified hip: Secondary | ICD-10-CM | POA: Diagnosis not present

## 2011-08-23 DIAGNOSIS — Z0000 Encounter for general adult medical examination without abnormal findings: Secondary | ICD-10-CM | POA: Diagnosis not present

## 2011-09-04 DIAGNOSIS — Z0000 Encounter for general adult medical examination without abnormal findings: Secondary | ICD-10-CM | POA: Diagnosis not present

## 2011-09-04 DIAGNOSIS — E785 Hyperlipidemia, unspecified: Secondary | ICD-10-CM | POA: Diagnosis not present

## 2011-09-04 DIAGNOSIS — J45909 Unspecified asthma, uncomplicated: Secondary | ICD-10-CM | POA: Diagnosis not present

## 2011-09-04 DIAGNOSIS — I10 Essential (primary) hypertension: Secondary | ICD-10-CM | POA: Diagnosis not present

## 2011-09-04 DIAGNOSIS — M25559 Pain in unspecified hip: Secondary | ICD-10-CM | POA: Diagnosis not present

## 2011-09-18 DIAGNOSIS — M1610 Unilateral primary osteoarthritis, unspecified hip: Secondary | ICD-10-CM | POA: Diagnosis not present

## 2011-09-18 DIAGNOSIS — M25559 Pain in unspecified hip: Secondary | ICD-10-CM | POA: Diagnosis not present

## 2011-09-27 ENCOUNTER — Telehealth: Payer: Self-pay | Admitting: Internal Medicine

## 2011-09-27 NOTE — Telephone Encounter (Signed)
Caller: Heidi Hoffman/Patient; Phone: [phone]; Reason for Call: Caller: Heidi Hoffman/Patient; Patient Name: Heidi Hoffman; PCP: Birdie Sons (Adults only); Best Callback Phone Number: [phone]  Patient states she is going to be scheduled for a hip replacement, per Orthopedic Surgeon/Dr.  Ranee Gosselin.  Patient states procedure has not yet been scheduled.  Patient states Dr.  Ranee Gosselin is sending a letter to Dr.  Cato Mulligan regarding patient having an appointment with Dr.  Cato Mulligan prior to surgery.  RN reviewed Epic Electronic Health Record and noted patient was last seen in office 05/10/11.   PATIENT INQUIRING WHEN SHE SHOULD SCHEDULE PRE OPERATIVE APPOINTMENT WITH DR.  Cato Mulligan.  PLEASE RETURN CALL TO PATIENT AT [phone].  Patient advised to return call when she is informed of the date of her surgical procedure.  Patient verbalizes understanding and agreeable.

## 2011-09-27 NOTE — Telephone Encounter (Signed)
Can you look and tell me where you want to work her in?

## 2011-10-31 ENCOUNTER — Ambulatory Visit (INDEPENDENT_AMBULATORY_CARE_PROVIDER_SITE_OTHER): Payer: Medicare Other | Admitting: Internal Medicine

## 2011-10-31 ENCOUNTER — Encounter: Payer: Self-pay | Admitting: Internal Medicine

## 2011-10-31 VITALS — BP 130/80 | HR 96 | Temp 98.3°F | Wt 193.0 lb

## 2011-10-31 DIAGNOSIS — Z23 Encounter for immunization: Secondary | ICD-10-CM | POA: Diagnosis not present

## 2011-10-31 DIAGNOSIS — D649 Anemia, unspecified: Secondary | ICD-10-CM

## 2011-10-31 DIAGNOSIS — M1710 Unilateral primary osteoarthritis, unspecified knee: Secondary | ICD-10-CM | POA: Diagnosis not present

## 2011-10-31 DIAGNOSIS — I10 Essential (primary) hypertension: Secondary | ICD-10-CM | POA: Diagnosis not present

## 2011-10-31 DIAGNOSIS — M179 Osteoarthritis of knee, unspecified: Secondary | ICD-10-CM

## 2011-10-31 MED ORDER — FLUTICASONE PROPIONATE HFA 44 MCG/ACT IN AERO: 2.0000 | INHALATION_SPRAY | Freq: Two times a day (BID) | RESPIRATORY_TRACT | Status: DC

## 2011-10-31 MED ORDER — FLUTICASONE PROPIONATE HFA 44 MCG/ACT IN AERO: 1.0000 | INHALATION_SPRAY | Freq: Two times a day (BID) | RESPIRATORY_TRACT | Status: DC

## 2011-10-31 NOTE — Assessment & Plan Note (Signed)
Trial flovent

## 2011-10-31 NOTE — Progress Notes (Signed)
  Subjective:    Patient ID: Heidi Hoffman, female    DOB: 1932/08/03, 78 y.o.   MRN: [id number]  HPI  Scheduled for left THA Otherwise feels well No hx of surgeries- reviewed today's ekg and cardiac cath from 2001.  She denies any cardiopulmonary complaints, she does have a hx of asthma. When questioned- she admits to using nebulizer every 3 weeks or so. "i use it because i'm wheezing"  Past Medical History  Diagnosis Date  . Asthma   . GERD (gastroesophageal reflux disease)   . Hyperlipidemia   . Hypertension   . Osteoporosis   . COPD (chronic obstructive pulmonary disease)   . Allergy   . Anemia   . Diverticulosis   . Tubular adenoma of colon 10/01     Review of Systems     Objective:   Physical Exam    Well-developed well-nourished female in no acute distress. HEENT exam atraumatic, normocephalic, extraocular muscles are intact. Neck is supple. No jugular venous distention no thyromegaly. Chest clear to auscultation without increased work of breathing. Cardiac exam S1 and S2 are regular. Abdominal exam active bowel sounds, soft, nontender. Extremities no edema. Waling with cane    Assessment & Plan:  End stage OA hip-- ok for surgery

## 2011-11-07 ENCOUNTER — Encounter (HOSPITAL_COMMUNITY): Payer: Self-pay | Admitting: Pharmacy Technician

## 2011-11-14 ENCOUNTER — Encounter (HOSPITAL_COMMUNITY)
Admission: RE | Admit: 2011-11-14 | Discharge: 2011-11-14 | Disposition: A | Discharge status: Home / Self Care | Payer: Medicare Other | Source: Ambulatory Visit | Attending: Orthopedic Surgery | Admitting: Orthopedic Surgery

## 2011-11-14 ENCOUNTER — Encounter (HOSPITAL_COMMUNITY): Payer: Self-pay

## 2011-11-14 ENCOUNTER — Ambulatory Visit (HOSPITAL_COMMUNITY)
Admission: RE | Admit: 2011-11-14 | Discharge: 2011-11-14 | Disposition: A | Discharge status: Home / Self Care | Payer: Medicare Other | Source: Ambulatory Visit | Attending: Surgical | Admitting: Surgical

## 2011-11-14 DIAGNOSIS — Z01818 Encounter for other preprocedural examination: Secondary | ICD-10-CM | POA: Diagnosis not present

## 2011-11-14 LAB — COMPREHENSIVE METABOLIC PANEL
ALT: 13 U/L (ref 0–35)
AST: 18 U/L (ref 0–37)
Albumin: 4.2 g/dL (ref 3.5–5.2)
Alkaline Phosphatase: 65 U/L (ref 39–117)
BUN: 16 mg/dL (ref 6–23)
CO2: 27 mEq/L (ref 19–32)
Calcium: 11.6 mg/dL — ABNORMAL HIGH (ref 8.4–10.5)
Chloride: 100 mEq/L (ref 96–112)
Creatinine, Ser: 1.03 mg/dL (ref 0.50–1.10)
GFR calc Af Amer: 60 mL/min — ABNORMAL LOW (ref >90)
GFR calc non Af Amer: 51 mL/min — ABNORMAL LOW (ref >90)
Glucose, Bld: 85 mg/dL (ref 70–99)
Potassium: 3.8 mEq/L (ref 3.5–5.1)
Sodium: 138 mEq/L (ref 135–145)
Total Bilirubin: 0.3 mg/dL (ref 0.3–1.2)
Total Protein: 8.4 g/dL — ABNORMAL HIGH (ref 6.0–8.3)

## 2011-11-14 LAB — URINE MICROSCOPIC-ADD ON

## 2011-11-14 LAB — URINALYSIS, ROUTINE W REFLEX MICROSCOPIC
Bilirubin Urine: NEGATIVE
Glucose, UA: NEGATIVE mg/dL
Hgb urine dipstick: NEGATIVE
Ketones, ur: NEGATIVE mg/dL
Nitrite: NEGATIVE
Protein, ur: NEGATIVE mg/dL
Specific Gravity, Urine: 1.019 (ref 1.005–1.030)
Urobilinogen, UA: 0.2 mg/dL (ref 0.0–1.0)
pH: 6 (ref 5.0–8.0)

## 2011-11-14 LAB — PROTIME-INR
INR: 1.03 (ref 0.00–1.49)
Prothrombin Time: 13.4 seconds (ref 11.6–15.2)

## 2011-11-14 LAB — CBC
HCT: 33.9 % — ABNORMAL LOW (ref 36.0–46.0)
Platelets: 373 10*3/uL (ref 150–400)
RBC: 3.97 MIL/uL (ref 3.87–5.11)
RDW: 14.5 % (ref 11.5–15.5)
WBC: 7.2 10*3/uL (ref 4.0–10.5)

## 2011-11-14 LAB — APTT: aPTT: 35 seconds (ref 24–37)

## 2011-11-14 LAB — SURGICAL PCR SCREEN
MRSA, PCR: NEGATIVE
Staphylococcus aureus: NEGATIVE

## 2011-11-14 NOTE — Patient Instructions (Addendum)
20 Heidi Hoffman  11/14/2011   Your procedure is scheduled on:  11-12 -2013  Report to [facility] at   2:00 PM.  Call this number if you have problems the morning of surgery: [phone]  Or Presurgical Testing [phone](Jayelle Page)   Remember:   Do not eat food:After Midnight.  May have clear liquids:up to 6 Hours before arrival. Nothing after :1000 AM  Clear liquids include soda, tea, black coffee, apple or grape juice, broth.  Take these medicines the morning of surgery with A SIP OF WATER: no oral meds except inhaler -Flovent and bring   Do not wear jewelry, make-up or nail polish.  Do not wear lotions, powders, or perfumes. You may wear deodorant.  Do not shave 48 hours prior to surgery.(face and neck okay, no shaving of legs)  Do not bring valuables to the hospital.  Contacts, dentures or bridgework may not be worn into surgery.  Leave suitcase in the car. After surgery it may be brought to your room.  For patients admitted to the hospital, checkout time is 11:00 AM the day of discharge.   Patients discharged the day of surgery will not be allowed to drive home. Must have responsible person with you x 24 hours once discharged.  Name and phone number of your driver:  [name] [phone] Special Instructions: CHG Shower Use Special Wash: see special instructions.(avoid face and genitals)   Please read over the following fact sheets that you were given: MRSA Information, Blood Transfusion fact sheet, Incentive Spirometry Instruction.

## 2011-11-14 NOTE — Pre-Procedure Instructions (Signed)
11-14-11 EKG(10'13)-Epic/ CXR done today.

## 2011-11-15 NOTE — H&P (Signed)
TOTAL HIP ADMISSION H&P  Patient is admitted for left total hip arthroplasty.  Subjective:  Chief Complaint: left hip pain  HPI: Heidi Hoffman, 78 y.o. female, has a history of pain and functional disability in the left hip(s) due to arthritis and avascular necrosis and patient has failed non-surgical conservative treatments for greater than 12 weeks to include NSAID's and/or analgesics, use of assistive devices and activity modification.  Onset of symptoms was abrupt starting 6 months ago with gradually worsening course since that time.The patient noted no past surgery on the left hip(s).  Patient currently rates pain in the left hip at 8 out of 10 with activity. Patient has night pain, worsening of pain with activity and weight bearing, pain that interfers with activities of daily living and pain with passive range of motion. Patient has evidence of joint space narrowing and necrosis of the femoral head by imaging studies. This condition presents safety issues increasing the risk of falls.  There is no current active infection.  Patient Active Problem List   Diagnosis Date Noted  . COPD 01/24/2010  . OSTEOPENIA 01/24/2010  . ANEMIA 01/20/2008  . ALLERGIC RHINITIS 04/28/2007  . HYPERLIPIDEMIA 08/21/2006  . HYPERTENSION 08/21/2006  . COLONIC POLYPS, HX OF 08/21/2006   Past Medical History  Diagnosis Date  . Asthma   . GERD (gastroesophageal reflux disease)   . Hyperlipidemia   . Hypertension   . Osteoporosis   . COPD (chronic obstructive pulmonary disease)   . Allergy   . Anemia   . Diverticulosis   . Tubular adenoma of colon 10/01    Past Surgical History  Procedure Date  . Cardiac catheterization 2001   Current outpatient prescriptions: acetaminophen (TYLENOL) 500 MG tablet, Take 500 mg by mouth every 6 (six) hours as needed. For pain,  Calcium Carbonate-Vit D-Min (CALTRATE 600+D PLUS) 600-400 MG-UNIT per tablet, Take 1 tablet by mouth daily. fluticasone (FLOVENT HFA) 44  MCG/ACT inhaler, Inhale 2 puffs into the lungs 2 (two) times daily.,  losartan-hydrochlorothiazide (HYZAAR) 100-25 MG per tablet Take 1 tablet by mouth every morning., Multiple Vitamin (MULTIVITAMIN) tablet, Take 1 tablet by mouth daily.,   No Active Allergies  History  Substance Use Topics  . Smoking status: Former Smoker    Quit date: 05/09/2000  . Smokeless tobacco: Not on file  . Alcohol Use: 0.6 oz/week    1 Glasses of wine per week     Comment: occ.    Family History  Problem Relation Age of Onset  . Heart disease Mother   . Diabetes Sister   . Asthma Daughter   . Asthma Son      Review of Systems  Constitutional: Negative.   HENT: Negative.  Negative for neck pain.   Eyes: Negative.   Respiratory: Positive for shortness of breath. Negative for cough, hemoptysis, sputum production and wheezing.        Shortness of breath on exertion  Cardiovascular: Negative.   Gastrointestinal: Positive for heartburn. Negative for nausea, vomiting, abdominal pain, diarrhea, constipation, blood in stool and melena.  Genitourinary: Positive for frequency. Negative for dysuria, urgency, hematuria and flank pain.  Musculoskeletal: Positive for back pain and joint pain. Negative for myalgias and falls.       Left hip pain  Skin: Negative.   Neurological: Negative.   Endo/Heme/Allergies: Negative.   Psychiatric/Behavioral: Negative.     Objective:  Physical Exam  Constitutional: She is oriented to person, place, and time. She appears well-developed and well-nourished. No distress.  HENT:  Head: Atraumatic.  Right Ear: External ear normal.  Left Ear: External ear normal.  Nose: Nose normal.  Mouth/Throat: Oropharynx is clear and moist.  Eyes: Conjunctivae normal and EOM are normal.  Neck: Normal range of motion. Neck supple. No thyromegaly present.  Cardiovascular: Normal rate, regular rhythm, normal heart sounds and intact distal pulses.   No murmur heard. Respiratory: Effort  normal. No respiratory distress. She has no wheezes. She exhibits no tenderness.  GI: Soft. Bowel sounds are normal. She exhibits no distension and no mass. There is no tenderness.  Musculoskeletal:       Right hip: Normal.       Left hip: She exhibits decreased range of motion.       Right knee: Normal.       Left knee: Normal.       Legs: Lymphadenopathy:    She has no cervical adenopathy.  Neurological: She is alert and oriented to person, place, and time. She has normal strength. No sensory deficit.  Skin: No rash noted. She is not diaphoretic. No erythema.  Psychiatric: She has a normal mood and affect.    Vitals Pulse: 76 (Regular) BP: 132/82 (Sitting, Left Arm, Standard)  Estimated Body mass index is 30.23 kg/(m^2) as calculated from the following:   Height as of 05/10/11: 5\' 7" (1.702 m).   Weight as of 10/31/11: 193 lb(87.544 kg).   Imaging Review Plain radiographs demonstrate moderate degenerative joint disease of the left hip(s). The bone quality appears to be fair for age and reported activity level.  Assessment/Plan:  End stage arthritis, left hip(s)  The patient history, physical examination, clinical judgement of the provider and imaging studies are consistent with end stage degenerative joint disease of the left hip(s) and total hip arthroplasty is deemed medically necessary. The treatment options including medical management, injection therapy, arthroscopy and arthroplasty were discussed at length. The risks and benefits of total hip arthroplasty were presented and reviewed. The risks due to aseptic loosening, infection, stiffness, dislocation/subluxation,  thromboembolic complications and other imponderables were discussed.  The patient acknowledged the explanation, agreed to proceed with the plan and consent was signed. Patient is being admitted for inpatient treatment for surgery, pain control, PT, OT, prophylactic antibiotics, VTE prophylaxis, progressive  ambulation and ADL's and discharge planning.The patient is planning to be discharged home with home health services     [place], [place]

## 2011-11-16 LAB — URINE CULTURE: Colony Count: >=100000

## 2011-11-19 NOTE — Progress Notes (Signed)
Called patient and informed of change in surgery time.To arrive on 11/20/11 at 1200 for 1400 surgery time. 

## 2011-11-20 ENCOUNTER — Inpatient Hospital Stay (HOSPITAL_COMMUNITY): Payer: Medicare Other

## 2011-11-20 ENCOUNTER — Encounter (HOSPITAL_COMMUNITY): Payer: Self-pay | Admitting: *Deleted

## 2011-11-20 ENCOUNTER — Encounter (HOSPITAL_COMMUNITY)
Admission: RE | Disposition: A | Discharge status: Skilled Nursing Facility | Payer: Self-pay | Source: Ambulatory Visit | Attending: Orthopedic Surgery

## 2011-11-20 ENCOUNTER — Inpatient Hospital Stay (HOSPITAL_COMMUNITY): Payer: Medicare Other | Admitting: Anesthesiology

## 2011-11-20 ENCOUNTER — Inpatient Hospital Stay (HOSPITAL_COMMUNITY)
Admission: RE | Admit: 2011-11-20 | Discharge: 2011-11-26 | DRG: 470 | Disposition: A | Discharge status: Skilled Nursing Facility | Payer: Medicare Other | Source: Ambulatory Visit | Attending: Orthopedic Surgery | Admitting: Orthopedic Surgery

## 2011-11-20 ENCOUNTER — Encounter (HOSPITAL_COMMUNITY): Payer: Self-pay | Admitting: Anesthesiology

## 2011-11-20 DIAGNOSIS — J449 Chronic obstructive pulmonary disease, unspecified: Secondary | ICD-10-CM | POA: Diagnosis present

## 2011-11-20 DIAGNOSIS — E785 Hyperlipidemia, unspecified: Secondary | ICD-10-CM | POA: Diagnosis not present

## 2011-11-20 DIAGNOSIS — I10 Essential (primary) hypertension: Secondary | ICD-10-CM | POA: Diagnosis not present

## 2011-11-20 DIAGNOSIS — Z96649 Presence of unspecified artificial hip joint: Secondary | ICD-10-CM | POA: Diagnosis not present

## 2011-11-20 DIAGNOSIS — J4489 Other specified chronic obstructive pulmonary disease: Secondary | ICD-10-CM | POA: Diagnosis present

## 2011-11-20 DIAGNOSIS — M810 Age-related osteoporosis without current pathological fracture: Secondary | ICD-10-CM | POA: Diagnosis present

## 2011-11-20 DIAGNOSIS — M1612 Unilateral primary osteoarthritis, left hip: Secondary | ICD-10-CM | POA: Diagnosis present

## 2011-11-20 DIAGNOSIS — M1990 Unspecified osteoarthritis, unspecified site: Secondary | ICD-10-CM | POA: Diagnosis not present

## 2011-11-20 DIAGNOSIS — M1610 Unilateral primary osteoarthritis, unspecified hip: Principal | ICD-10-CM | POA: Diagnosis present

## 2011-11-20 DIAGNOSIS — M169 Osteoarthritis of hip, unspecified: Principal | ICD-10-CM | POA: Diagnosis present

## 2011-11-20 DIAGNOSIS — D62 Acute posthemorrhagic anemia: Secondary | ICD-10-CM | POA: Diagnosis not present

## 2011-11-20 DIAGNOSIS — K219 Gastro-esophageal reflux disease without esophagitis: Secondary | ICD-10-CM | POA: Diagnosis not present

## 2011-11-20 DIAGNOSIS — J45909 Unspecified asthma, uncomplicated: Secondary | ICD-10-CM | POA: Diagnosis not present

## 2011-11-20 DIAGNOSIS — Z5189 Encounter for other specified aftercare: Secondary | ICD-10-CM | POA: Diagnosis not present

## 2011-11-20 DIAGNOSIS — K2100 Gastro-esophageal reflux disease with esophagitis, without bleeding: Secondary | ICD-10-CM | POA: Diagnosis not present

## 2011-11-20 DIAGNOSIS — M87059 Idiopathic aseptic necrosis of unspecified femur: Secondary | ICD-10-CM | POA: Diagnosis not present

## 2011-11-20 DIAGNOSIS — D649 Anemia, unspecified: Secondary | ICD-10-CM

## 2011-11-20 DIAGNOSIS — M25559 Pain in unspecified hip: Secondary | ICD-10-CM | POA: Diagnosis not present

## 2011-11-20 DIAGNOSIS — M129 Arthropathy, unspecified: Secondary | ICD-10-CM | POA: Diagnosis not present

## 2011-11-20 HISTORY — PX: TOTAL HIP ARTHROPLASTY: SHX124

## 2011-11-20 SURGERY — ARTHROPLASTY, HIP, TOTAL,POSTERIOR APPROACH
Anesthesia: General | Site: Hip | Laterality: Left | Wound class: Clean

## 2011-11-20 MED ORDER — RIVAROXABAN 10 MG PO TABS
10.0000 mg | ORAL_TABLET | Freq: Every day | ORAL | Status: DC
  Administered 2011-11-21 – 2011-11-26 (×6): 10 mg via ORAL
  Filled 2011-11-20 (×7): qty 1

## 2011-11-20 MED ORDER — MENTHOL 3 MG MT LOZG
1.0000 | LOZENGE | OROMUCOSAL | Status: DC | PRN
  Administered 2011-11-22: 3 mg via ORAL
  Filled 2011-11-20 (×2): qty 9

## 2011-11-20 MED ORDER — HYDROMORPHONE HCL PF 1 MG/ML IJ SOLN
INTRAMUSCULAR | Status: AC
  Filled 2011-11-20: qty 1

## 2011-11-20 MED ORDER — CHLORHEXIDINE GLUCONATE 4 % EX LIQD: 60.0000 mL | Freq: Once | CUTANEOUS | Status: DC

## 2011-11-20 MED ORDER — ACETAMINOPHEN 650 MG RE SUPP: 650.0000 mg | Freq: Four times a day (QID) | RECTAL | Status: DC | PRN

## 2011-11-20 MED ORDER — METHOCARBAMOL 500 MG PO TABS
500.0000 mg | ORAL_TABLET | Freq: Four times a day (QID) | ORAL | Status: DC | PRN
  Administered 2011-11-21: 500 mg via ORAL
  Filled 2011-11-20: qty 1

## 2011-11-20 MED ORDER — SODIUM CHLORIDE 0.9 % IR SOLN
Status: DC | PRN
  Administered 2011-11-20: 14:00:00

## 2011-11-20 MED ORDER — OXYCODONE-ACETAMINOPHEN 5-325 MG PO TABS: 2.0000 | ORAL_TABLET | ORAL | Status: DC | PRN

## 2011-11-20 MED ORDER — ROCURONIUM BROMIDE 100 MG/10ML IV SOLN
INTRAVENOUS | Status: DC | PRN
  Administered 2011-11-20: 50 mg via INTRAVENOUS

## 2011-11-20 MED ORDER — FLUTICASONE PROPIONATE HFA 44 MCG/ACT IN AERO
2.0000 | INHALATION_SPRAY | Freq: Two times a day (BID) | RESPIRATORY_TRACT | Status: DC
  Administered 2011-11-22 – 2011-11-26 (×8): 2 via RESPIRATORY_TRACT
  Filled 2011-11-20 (×3): qty 10.6

## 2011-11-20 MED ORDER — POLYETHYLENE GLYCOL 3350 17 G PO PACK
17.0000 g | PACK | Freq: Every day | ORAL | Status: DC | PRN
  Administered 2011-11-21 – 2011-11-22 (×2): 17 g via ORAL
  Filled 2011-11-20 (×2): qty 1

## 2011-11-20 MED ORDER — BISACODYL 10 MG RE SUPP
10.0000 mg | Freq: Every day | RECTAL | Status: DC | PRN
  Administered 2011-11-24: 10 mg via RECTAL
  Filled 2011-11-20: qty 1

## 2011-11-20 MED ORDER — CEFAZOLIN SODIUM-DEXTROSE 2-3 GM-% IV SOLR
2.0000 g | INTRAVENOUS | Status: DC
  Administered 2011-11-20: 2 g via INTRAVENOUS

## 2011-11-20 MED ORDER — HYDROMORPHONE HCL PF 1 MG/ML IJ SOLN
0.2500 mg | INTRAMUSCULAR | Status: DC | PRN
  Administered 2011-11-20 (×3): 0.5 mg via INTRAVENOUS

## 2011-11-20 MED ORDER — BACITRACIN ZINC 500 UNIT/GM EX OINT
TOPICAL_OINTMENT | CUTANEOUS | Status: AC
  Filled 2011-11-20: qty 15

## 2011-11-20 MED ORDER — FENTANYL CITRATE 0.05 MG/ML IJ SOLN
INTRAMUSCULAR | Status: DC | PRN
  Administered 2011-11-20: 100 ug via INTRAVENOUS
  Administered 2011-11-20: 50 ug via INTRAVENOUS
  Administered 2011-11-20: 100 ug via INTRAVENOUS

## 2011-11-20 MED ORDER — GLYCOPYRROLATE 0.2 MG/ML IJ SOLN
INTRAMUSCULAR | Status: DC | PRN
  Administered 2011-11-20: 0.4 mg via INTRAVENOUS

## 2011-11-20 MED ORDER — THROMBIN 5000 UNITS EX SOLR
CUTANEOUS | Status: AC
  Filled 2011-11-20: qty 5000

## 2011-11-20 MED ORDER — LACTATED RINGERS IV SOLN
INTRAVENOUS | Status: DC
  Administered 2011-11-20: 100 mL/h via INTRAVENOUS
  Administered 2011-11-21 (×2): via INTRAVENOUS
  Administered 2011-11-22: 100 mL/h via INTRAVENOUS
  Administered 2011-11-22 – 2011-11-23 (×3): via INTRAVENOUS
  Administered 2011-11-24 – 2011-11-25 (×2): 100 mL/h via INTRAVENOUS

## 2011-11-20 MED ORDER — THROMBIN 5000 UNITS EX SOLR
CUTANEOUS | Status: DC | PRN
  Administered 2011-11-20: 5000 [IU] via TOPICAL

## 2011-11-20 MED ORDER — LACTATED RINGERS IV SOLN: INTRAVENOUS | Status: DC

## 2011-11-20 MED ORDER — ACETAMINOPHEN 325 MG PO TABS: 650.0000 mg | ORAL_TABLET | Freq: Four times a day (QID) | ORAL | Status: DC | PRN

## 2011-11-20 MED ORDER — HYDROMORPHONE HCL PF 1 MG/ML IJ SOLN
INTRAMUSCULAR | Status: DC | PRN
  Administered 2011-11-20 (×2): 1 mg via INTRAVENOUS

## 2011-11-20 MED ORDER — NEOSTIGMINE METHYLSULFATE 1 MG/ML IJ SOLN
INTRAMUSCULAR | Status: DC | PRN
  Administered 2011-11-20: 3 mg via INTRAVENOUS

## 2011-11-20 MED ORDER — CEFAZOLIN SODIUM 1-5 GM-% IV SOLN
1.0000 g | Freq: Four times a day (QID) | INTRAVENOUS | Status: AC
  Administered 2011-11-20 (×2): 1 g via INTRAVENOUS
  Filled 2011-11-20 (×2): qty 50

## 2011-11-20 MED ORDER — LACTATED RINGERS IV SOLN
INTRAVENOUS | Status: DC
  Administered 2011-11-20 (×2): via INTRAVENOUS
  Administered 2011-11-20: 1000 mL via INTRAVENOUS

## 2011-11-20 MED ORDER — ACETAMINOPHEN 10 MG/ML IV SOLN
INTRAVENOUS | Status: DC | PRN
  Administered 2011-11-20: 1000 mg via INTRAVENOUS

## 2011-11-20 MED ORDER — HYDROCODONE-ACETAMINOPHEN 5-325 MG PO TABS
1.0000 | ORAL_TABLET | ORAL | Status: DC | PRN
  Administered 2011-11-21 – 2011-11-26 (×7): 2 via ORAL
  Filled 2011-11-20 (×7): qty 2

## 2011-11-20 MED ORDER — HYDROMORPHONE HCL PF 1 MG/ML IJ SOLN
1.0000 mg | INTRAMUSCULAR | Status: DC | PRN
  Administered 2011-11-21 – 2011-11-22 (×3): 1 mg via INTRAVENOUS
  Filled 2011-11-20 (×3): qty 1

## 2011-11-20 MED ORDER — BACITRACIN ZINC 500 UNIT/GM EX OINT
TOPICAL_OINTMENT | CUTANEOUS | Status: DC | PRN
  Administered 2011-11-20: 1 via TOPICAL

## 2011-11-20 MED ORDER — ONDANSETRON HCL 4 MG PO TABS: 4.0000 mg | ORAL_TABLET | Freq: Four times a day (QID) | ORAL | Status: DC | PRN

## 2011-11-20 MED ORDER — BUPIVACAINE LIPOSOME 1.3 % IJ SUSP
INTRAMUSCULAR | Status: DC | PRN
  Administered 2011-11-20: 20 mL

## 2011-11-20 MED ORDER — HYDROCHLOROTHIAZIDE 25 MG PO TABS
25.0000 mg | ORAL_TABLET | Freq: Every day | ORAL | Status: DC
  Administered 2011-11-21 – 2011-11-26 (×6): 25 mg via ORAL
  Filled 2011-11-20 (×6): qty 1

## 2011-11-20 MED ORDER — PROMETHAZINE HCL 25 MG/ML IJ SOLN: 6.2500 mg | INTRAMUSCULAR | Status: DC | PRN

## 2011-11-20 MED ORDER — FLEET ENEMA 7-19 GM/118ML RE ENEM: 1.0000 | ENEMA | Freq: Once | RECTAL | Status: AC | PRN

## 2011-11-20 MED ORDER — BUPIVACAINE LIPOSOME 1.3 % IJ SUSP
20.0000 mL | Freq: Once | INTRAMUSCULAR | Status: DC
  Filled 2011-11-20: qty 20

## 2011-11-20 MED ORDER — FERROUS SULFATE 325 (65 FE) MG PO TABS
325.0000 mg | ORAL_TABLET | Freq: Three times a day (TID) | ORAL | Status: DC
  Administered 2011-11-21 – 2011-11-26 (×17): 325 mg via ORAL
  Filled 2011-11-20 (×23): qty 1

## 2011-11-20 MED ORDER — PHENOL 1.4 % MT LIQD
1.0000 | OROMUCOSAL | Status: DC | PRN
  Filled 2011-11-20: qty 177

## 2011-11-20 MED ORDER — ALUM & MAG HYDROXIDE-SIMETH 200-200-20 MG/5ML PO SUSP: 30.0000 mL | ORAL | Status: DC | PRN

## 2011-11-20 MED ORDER — ONDANSETRON HCL 4 MG/2ML IJ SOLN: 4.0000 mg | Freq: Four times a day (QID) | INTRAMUSCULAR | Status: DC | PRN

## 2011-11-20 MED ORDER — LOSARTAN POTASSIUM 50 MG PO TABS
100.0000 mg | ORAL_TABLET | Freq: Every day | ORAL | Status: DC
  Administered 2011-11-21 – 2011-11-26 (×6): 100 mg via ORAL
  Filled 2011-11-20 (×6): qty 2

## 2011-11-20 MED ORDER — PROPOFOL 10 MG/ML IV BOLUS
INTRAVENOUS | Status: DC | PRN
  Administered 2011-11-20: 130 mg via INTRAVENOUS

## 2011-11-20 MED ORDER — HYDRALAZINE HCL 20 MG/ML IJ SOLN
INTRAMUSCULAR | Status: DC | PRN
  Administered 2011-11-20: 4 mg via INTRAVENOUS

## 2011-11-20 MED ORDER — SODIUM CHLORIDE 0.9 % IJ SOLN
INTRAMUSCULAR | Status: DC | PRN
  Administered 2011-11-20: 50 mL via INTRAVENOUS

## 2011-11-20 MED ORDER — LOSARTAN POTASSIUM-HCTZ 100-25 MG PO TABS: 1.0000 | ORAL_TABLET | Freq: Every morning | ORAL | Status: DC

## 2011-11-20 MED ORDER — METHOCARBAMOL 100 MG/ML IJ SOLN
500.0000 mg | Freq: Four times a day (QID) | INTRAVENOUS | Status: DC | PRN
  Filled 2011-11-20: qty 5

## 2011-11-20 SURGICAL SUPPLY — 50 items
BAG ZIPLOCK 12X15 (MISCELLANEOUS) ×2 IMPLANT
BLADE SAW SAG 73X25 THK (BLADE) ×1
BLADE SAW SGTL 73X25 THK (BLADE) ×1 IMPLANT
BNDG COHESIVE 6X5 TAN STRL LF (GAUZE/BANDAGES/DRESSINGS) ×2 IMPLANT
CLOTH BEACON ORANGE TIMEOUT ST (SAFETY) ×2 IMPLANT
DRAPE INCISE IOBAN 66X45 STRL (DRAPES) ×2 IMPLANT
DRAPE INCISE IOBAN 85X60 (DRAPES) ×2 IMPLANT
DRAPE ORTHO SPLIT 77X108 STRL (DRAPES) ×2
DRAPE POUCH INSTRU U-SHP 10X18 (DRAPES) ×2 IMPLANT
DRAPE SURG 17X11 SM STRL (DRAPES) ×2 IMPLANT
DRAPE SURG ORHT 6 SPLT 77X108 (DRAPES) ×2 IMPLANT
DRAPE U-SHAPE 47X51 STRL (DRAPES) ×2 IMPLANT
DRSG EMULSION OIL 3X16 NADH (GAUZE/BANDAGES/DRESSINGS) ×2 IMPLANT
DRSG PAD ABDOMINAL 8X10 ST (GAUZE/BANDAGES/DRESSINGS) ×2 IMPLANT
DURAPREP 26ML APPLICATOR (WOUND CARE) ×2 IMPLANT
ELECT BLADE TIP CTD 4 INCH (ELECTRODE) ×2 IMPLANT
ELECT REM PT RETURN 9FT ADLT (ELECTROSURGICAL) ×2
ELECTRODE REM PT RTRN 9FT ADLT (ELECTROSURGICAL) ×1 IMPLANT
EVACUATOR 1/8 PVC DRAIN (DRAIN) ×2 IMPLANT
FACESHIELD LNG OPTICON STERILE (SAFETY) ×8 IMPLANT
FLOSEAL 10ML (HEMOSTASIS) IMPLANT
GLOVE BIOGEL M 6.5 STRL (GLOVE) ×2 IMPLANT
GLOVE BIOGEL PI IND STRL 8 (GLOVE) ×1 IMPLANT
GLOVE BIOGEL PI IND STRL 8.5 (GLOVE) ×1 IMPLANT
GLOVE BIOGEL PI INDICATOR 8 (GLOVE) ×1
GLOVE BIOGEL PI INDICATOR 8.5 (GLOVE) ×1
GLOVE ECLIPSE 8.0 STRL XLNG CF (GLOVE) ×8 IMPLANT
GOWN PREVENTION PLUS LG XLONG (DISPOSABLE) ×4 IMPLANT
GOWN STRL REIN XL XLG (GOWN DISPOSABLE) ×4 IMPLANT
IMMOBILIZER KNEE 20 (SOFTGOODS)
IMMOBILIZER KNEE 20 THIGH 36 (SOFTGOODS) IMPLANT
KIT BASIN OR (CUSTOM PROCEDURE TRAY) ×2 IMPLANT
MANIFOLD NEPTUNE II (INSTRUMENTS) ×2 IMPLANT
NEEDLE HYPO 22GX1.5 SAFETY (NEEDLE) ×2 IMPLANT
PACK TOTAL JOINT (CUSTOM PROCEDURE TRAY) ×2 IMPLANT
POSITIONER SURGICAL ARM (MISCELLANEOUS) ×2 IMPLANT
SPONGE GAUZE 4X4 12PLY (GAUZE/BANDAGES/DRESSINGS) ×2 IMPLANT
SPONGE LAP 18X18 X RAY DECT (DISPOSABLE) ×8 IMPLANT
SPONGE LAP 4X18 X RAY DECT (DISPOSABLE) ×2 IMPLANT
SPONGE SURGIFOAM ABS GEL 100 (HEMOSTASIS) ×2 IMPLANT
STAPLER VISISTAT 35W (STAPLE) ×2 IMPLANT
SUCTION FRAZIER TIP 10 FR DISP (SUCTIONS) ×2 IMPLANT
SUT VIC AB 0 CT1 27 (SUTURE) ×2
SUT VIC AB 0 CT1 27XBRD ANTBC (SUTURE) ×2 IMPLANT
SUT VIC AB 1 CT1 27 (SUTURE) ×5
SUT VIC AB 1 CT1 27XBRD ANTBC (SUTURE) ×5 IMPLANT
SYR 20CC LL (SYRINGE) ×2 IMPLANT
TOWEL OR 17X26 10 PK STRL BLUE (TOWEL DISPOSABLE) ×4 IMPLANT
TRAY FOLEY CATH 14FRSI W/METER (CATHETERS) ×2 IMPLANT
WATER STERILE IRR 1500ML POUR (IV SOLUTION) ×2 IMPLANT

## 2011-11-20 NOTE — Brief Op Note (Signed)
11/20/2011  2:34 PM  PATIENT:  Heidi Hoffman  78 y.o. female  PRE-OPERATIVE DIAGNOSIS:  Osteoarthritis of the Left Hip  POST-OPERATIVE DIAGNOSIS:  Osteoarthritis of the Left Hip  PROCEDURE:  Procedure(s) (LRB) with comments: TOTAL HIP ARTHROPLASTY (Left)  SURGEON:  Surgeon(s) and Role:    * Jacki Cones, MD - Primary    * Shelda Pal, MD     ASSISTANTS: Lajoyce Corners MD}   ANESTHESIA:   general  EBL:  Total I/O In: 2000 [I.V.:2000] Out: 800 [Urine:150; Blood:650]  BLOOD ADMINISTERED:none  DRAINS: none   LOCAL MEDICATIONS USED:  BUPIVICAINE20cc mixed with 20cc Normal Saline   SPECIMEN:  No Specimen  DISPOSITION OF SPECIMEN:  N/A  COUNTS:  YES  TOURNIQUET:  * No tourniquets in log *  DICTATION: .Other Dictation: Dictation Number [id number]  PLAN OF CARE: Admit to inpatient   PATIENT DISPOSITION:  PACU - hemodynamically stable.   Delay start of Pharmacological VTE agent (>24hrs) due to surgical blood loss or risk of bleeding: yes

## 2011-11-20 NOTE — Transfer of Care (Signed)
Immediate Anesthesia Transfer of Care Note  Patient: Heidi Hoffman  Procedure(s) Performed: Procedure(s) (LRB) with comments: TOTAL HIP ARTHROPLASTY (Left)  Patient Location: PACU  Anesthesia Type:General  Level of Consciousness: awake, sedated and confused  Airway & Oxygen Therapy: Patient Spontanous Breathing and Patient connected to face mask oxygen  Post-op Assessment: Report given to PACU RN and Post -op Vital signs reviewed and stable  Post vital signs: Reviewed and stable  Complications: No apparent anesthesia complications

## 2011-11-20 NOTE — Progress Notes (Signed)
Patient arousable but drowsy, surgical site dressing dry and intact no bleeding noted.

## 2011-11-20 NOTE — Interval H&P Note (Signed)
History and Physical Interval Note:  11/20/2011 12:37 PM  Heidi Hoffman  has presented today for surgery, with the diagnosis of Osteoarthritis of the Left Hip  The various methods of treatment have been discussed with the patient and family. After consideration of risks, benefits and other options for treatment, the patient has consented to  Procedure(s) (LRB) with comments: TOTAL HIP ARTHROPLASTY (Left) as a surgical intervention .  The patient's history has been reviewed, patient examined, no change in status, stable for surgery.  I have reviewed the patient's chart and labs.  Questions were answered to the patient's satisfaction.     Giovanni Bath A

## 2011-11-20 NOTE — H&P (View-Only) (Signed)
Called patient and informed of change in surgery time.To arrive on 11/20/11 at 1200 for 1400 surgery time.

## 2011-11-20 NOTE — Interval H&P Note (Signed)
History and Physical Interval Note:  11/20/2011 12:36 PM  Heidi Hoffman  has presented today for surgery, with the diagnosis of Osteoarthritis of the Left Hip  The various methods of treatment have been discussed with the patient and family. After consideration of risks, benefits and other options for treatment, the patient has consented to  Procedure(s) (LRB) with comments: TOTAL HIP ARTHROPLASTY (Left) as a surgical intervention .  The patient's history has been reviewed, patient examined, no change in status, stable for surgery.  I have reviewed the patient's chart and labs.  Questions were answered to the patient's satisfaction.     Dustan Hyams A

## 2011-11-20 NOTE — Anesthesia Preprocedure Evaluation (Signed)
Anesthesia Evaluation  Patient identified by MRN, date of birth, ID band Patient awake    Reviewed: Allergy & Precautions, H&P , NPO status , Patient's Chart, lab work & pertinent test results  Airway Mallampati: II TM Distance: >3 FB Neck ROM: Full    Dental  (+) Edentulous Upper and Edentulous Lower   Pulmonary asthma , COPD breath sounds clear to auscultation  Pulmonary exam normal       Cardiovascular hypertension, Pt. on medications Rhythm:Regular     Neuro/Psych negative neurological ROS  negative psych ROS   GI/Hepatic Neg liver ROS, GERD-  Medicated,  Endo/Other  negative endocrine ROS  Renal/GU negative Renal ROS  negative genitourinary   Musculoskeletal negative musculoskeletal ROS (+)   Abdominal   Peds negative pediatric ROS (+)  Hematology negative hematology ROS (+)   Anesthesia Other Findings   Reproductive/Obstetrics negative OB ROS                           Anesthesia Physical Anesthesia Plan  ASA: II  Anesthesia Plan: General   Post-op Pain Management:    Induction: Intravenous  Airway Management Planned: Oral ETT  Additional Equipment:   Intra-op Plan:   Post-operative Plan: Extubation in OR  Informed Consent: I have reviewed the patients History and Physical, chart, labs and discussed the procedure including the risks, benefits and alternatives for the proposed anesthesia with the patient or authorized representative who has indicated his/her understanding and acceptance.   Dental advisory given  Plan Discussed with: CRNA  Anesthesia Plan Comments:         Anesthesia Quick Evaluation

## 2011-11-20 NOTE — Anesthesia Postprocedure Evaluation (Signed)
  Anesthesia Post-op Note  Patient: Heidi Hoffman  Procedure(s) Performed: Procedure(s) (LRB): TOTAL HIP ARTHROPLASTY (Left)  Patient Location: PACU  Anesthesia Type: General  Level of Consciousness: awake and alert   Airway and Oxygen Therapy: Patient Spontanous Breathing  Post-op Pain: mild  Post-op Assessment: Post-op Vital signs reviewed, Patient's Cardiovascular Status Stable, Respiratory Function Stable, Patent Airway and No signs of Nausea or vomiting  Post-op Vital Signs: stable  Complications: No apparent anesthesia complications

## 2011-11-21 ENCOUNTER — Encounter (HOSPITAL_COMMUNITY): Payer: Self-pay | Admitting: Orthopedic Surgery

## 2011-11-21 DIAGNOSIS — D62 Acute posthemorrhagic anemia: Secondary | ICD-10-CM | POA: Diagnosis not present

## 2011-11-21 LAB — CBC
MCHC: 32.6 g/dL (ref 30.0–36.0)
MCV: 86.6 fL (ref 78.0–100.0)
Platelets: 273 10*3/uL (ref 150–400)
RDW: 14.7 % (ref 11.5–15.5)
WBC: 9.9 10*3/uL (ref 4.0–10.5)

## 2011-11-21 LAB — BASIC METABOLIC PANEL
Calcium: 10.1 mg/dL (ref 8.4–10.5)
Chloride: 101 mEq/L (ref 96–112)
Creatinine, Ser: 1.43 mg/dL — ABNORMAL HIGH (ref 0.50–1.10)
GFR calc Af Amer: 40 mL/min — ABNORMAL LOW (ref >90)
GFR calc non Af Amer: 35 mL/min — ABNORMAL LOW (ref >90)

## 2011-11-21 LAB — HEMOGLOBIN AND HEMATOCRIT, BLOOD
HCT: 23.1 % — ABNORMAL LOW (ref 36.0–46.0)
Hemoglobin: 7.7 g/dL — ABNORMAL LOW (ref 12.0–15.0)

## 2011-11-21 MED ORDER — VITAMINS A & D EX OINT
TOPICAL_OINTMENT | CUTANEOUS | Status: AC
  Administered 2011-11-21: 5
  Filled 2011-11-21: qty 5

## 2011-11-21 NOTE — Op Note (Signed)
Heidi Hoffman, Heidi Hoffman              ACCOUNT NO.:  [id number]  MEDICAL RECORD NO.:  [id number]  LOCATION:  1443                         FACILITY:  [facility]  PHYSICIAN:  Georges Lynch. Dmitri Pettigrew, M.D.DATE OF BIRTH:  [birth year]  DATE OF PROCEDURE: DATE OF DISCHARGE:                              OPERATIVE REPORT   SURGEON:  Dionne Rossa A. Darrelyn Hillock, M.D.  ASSISTANT:  Madlyn Frankel. Charlann Boxer, MD and Lanney Gins, PA.  PREOPERATIVE DIAGNOSIS:  Severe degenerative arthritis, left hip with bone on bone.  POSTOP DIAGNOSIS:  Severe degenerative arthritis, left hip with bone on bone.  OPERATION:  A left total hip arthroplasty utilizing the DePuy system. The size used was a 52 mm pinnacle cup with a 36 mm inside diameter, AltrX polyethylene liner.  One screw was used to fix the cup and into the acetabulum we also used a hole eliminator.  The Tri-Lock stem was a standard offset Tri-Lock stem size 5 with a ceramic head 36 mm diameter +5.  PROCEDURE:  Under general anesthesia, routine orthopedic prep and draping of the left hip was carried out with the right hip down left side out.  The appropriate time-out was carried out first.  I also marked the appropriate left lower extremity in the holding area.  At this time, the posterior lateral approach to the hip was carried out. Bleeders were identified and cauterized.  Self-retaining retractors were inserted.  I then went down and incised the iliotibial band and extended the incision up by blunt dissection into the gluteal muscle.  Great care taken not to injure the sciatic nerve.  At this time, I partially detached external rotators in the usual fashion.  I then did a capsulectomy, dislocated the femoral head, amputated the femoral head at the appropriate neck length.  I then utilized my box osteotome to remove the cancellous bone from the trochanter.  I then used a widening reamer and then the canal finder.  Once this was done, I thoroughly irrigated out the canal,  packed the canal open and then attention was directed to the acetabulum.  I completed the capsulectomy.  I then reamed the acetabulum up to a size 51 for a 52 mm cup.  The 52 mm cup then was inserted at the appropriate angle.  We did protect the ankle with the Charnley guide.  I then utilized 1 screw for fixation.  Then inserted the hole eliminator.  Following that, we inserted the 52 mm outside diameter, 36 mm inside diameter, Ultrex liner.  We then went through trials with a +1.5 and +5 ball and we had good stability.  Following that, we then removed the trial components and inserted our permanent standard offset Tri-Lock stem size 5.  We then inserted our ceramic head which is a +5, 36 mm diameter.  We cleared the acetabulum, reduced the hip and had excellent motion during the entire procedure.  We did check leg length and stability.  We then thoroughly irrigated out the area, loosely applied thrombin-soaked Gelfoam, closed the wound layers in usual fashion.  After closing the deep layers I injected a mixture of bupivacaine and normal saline 20 mL of bupivacaine, 20 mL of normal saline into the wound  site.  I then closed the remaining part of the wound in usual fashion.  Closed the skin with metal staples and a sterile Neosporin dressing was applied.  The patient had 2 g of IV Ancef preop.          ______________________________ Georges Lynch. Darrelyn Hillock, M.D.     RAG/MEDQ  D:  11/20/2011  T:  11/21/2011  Job:  [id number]

## 2011-11-21 NOTE — Addendum Note (Signed)
Addendum  created 11/21/11 1411 by Lenola Lockner A Yarielys Beed, CRNA   Modules edited:Anesthesia Medication Administration    

## 2011-11-21 NOTE — Progress Notes (Signed)
Patient has a bed available at [facility] when medically stable - anticipating discharge Friday.   Clinical Social Work Department CLINICAL SOCIAL WORK PLACEMENT NOTE 11/21/2011  Patient:  Heidi Hoffman, Heidi Hoffman  Account Number:  [id number] Admit date:  11/20/2011  Clinical Social Worker:  Orpah Greek  Date/time:  11/21/2011 04:00 PM  Clinical Social Work is seeking post-discharge placement for this patient at the following level of care:   SKILLED NURSING   (*CSW will update this form in Epic as items are completed)   11/21/2011  Patient/family provided with [facility] Department of Clinical Social Work's list of facilities offering this level of care within the geographic area requested by the patient (or if unable, by the patient's family).  11/21/2011  Patient/family informed of their freedom to choose among providers that offer the needed level of care, that participate in Medicare, Medicaid or managed care program needed by the patient, have an available bed and are willing to accept the patient.  11/21/2011  Patient/family informed of [facility]' ownership interest in [facility], as well as of the fact that they are under no obligation to receive care at this facility.  PASARR submitted to EDS on 11/21/2011 PASARR number received from EDS on 11/21/2011  FL2 transmitted to all facilities in geographic area requested by pt/family on  11/21/2011 FL2 transmitted to all facilities within larger geographic area on   Patient informed that his/her managed care company has contracts with or will negotiate with  certain facilities, including the following:     Patient/family informed of bed offers received:  11/21/2011 Patient chooses bed at [facility] Physician recommends and patient chooses bed at    Patient to be transferred to [facility] on   Patient to be transferred to facility by   The following physician request were entered  in Epic:   Additional Comments:  Unice Bailey, LCSW [facility] Clinical Social Worker cell #: [phone]

## 2011-11-21 NOTE — Progress Notes (Signed)
Called and spoke with MD hewitt concerning pt's hgb dropping. MD stated to continue to monitor pt for s/s .

## 2011-11-21 NOTE — Addendum Note (Signed)
Addendum  created 11/21/11 1411 by Elesa Massed, CRNA   Modules edited:Anesthesia Medication Administration

## 2011-11-21 NOTE — Evaluation (Signed)
Physical Therapy Evaluation Patient Details Name: Heidi Hoffman MRN: [id number] DOB: [birth year] Today's Date: 11/21/2011 Time: 0865-7846 PT Time Calculation (min): 32 min  PT Assessment / Plan / Recommendation Clinical Impression  Pt. was admitted 11/12 for L posterior THA. Pt. was able to ambulate 8 ft. today. Pt. plans SNF. Pt. will benmefit from PT while in Hospital.    PT Assessment  Patient needs continued PT services    Follow Up Recommendations  SNF    Does the patient have the potential to tolerate intense rehabilitation      Barriers to Discharge Decreased caregiver support      Equipment Recommendations  Rolling walker with 5" wheels    Recommendations for Other Services     Frequency 7X/week    Precautions / Restrictions Precautions Precautions: Posterior Hip Restrictions Weight Bearing Restrictions: No LLE Weight Bearing: Partial weight bearing LLE Partial Weight Bearing Percentage or Pounds: 50%   Pertinent Vitals/Pain Pre 123/59 100% 60 HR Post 134/64 100% 94HR Pain 4-7  Requested Muscle relaxer,     Mobility  Bed Mobility Bed Mobility: Supine to Sit Supine to Sit: 3: Mod assist;HOB elevated;With rails Details for Bed Mobility Assistance: VC for technique and hip precautions as pt moved to edge., Transfers Transfers: Sit to Stand;Stand to Sit Sit to Stand: 1: +2 Total assist;From bed;From elevated surface;With upper extremity assist Sit to Stand: Patient Percentage: 60% Stand to Sit: 4: Min assist;With armrests;To chair/3-in-1 Details for Transfer Assistance: VC for pushing with UE and to reach to recliner, also to palxce RLE out prior to sitting down. Ambulation/Gait Ambulation/Gait Assistance: 1: +2 Total assist Ambulation/Gait: Patient Percentage: 70% Ambulation Distance (Feet): 8 Feet Ambulation/Gait Assistance Details: VC for sequence and PWB Gait Pattern: Step-to pattern;Decreased step length - left;Decreased stance time -  left;Antalgic Gait velocity: decreased    Shoulder Instructions     Exercises Total Joint Exercises Ankle Circles/Pumps: AROM;Both;10 reps Heel Slides: AAROM;Left;10 reps;Supine Hip ABduction/ADduction: AAROM;Left;10 reps;Supine   PT Diagnosis: Difficulty walking;Acute pain  PT Problem List: Decreased strength;Decreased range of motion;Decreased activity tolerance;Decreased mobility;Decreased safety awareness;Decreased knowledge of use of DME;Decreased knowledge of precautions;Cardiopulmonary status limiting activity PT Treatment Interventions: DME instruction;Gait training;Functional mobility training;Therapeutic activities;Therapeutic exercise;Patient/family education   PT Goals Acute Rehab PT Goals PT Goal Formulation: With patient/family Time For Goal Achievement: 11/28/11 Potential to Achieve Goals: Good Pt will go Supine/Side to Sit: with supervision PT Goal: Supine/Side to Sit - Progress: Goal set today Pt will go Sit to Supine/Side: with supervision PT Goal: Sit to Supine/Side - Progress: Goal set today Pt will go Sit to Stand: with supervision PT Goal: Sit to Stand - Progress: Goal set today Pt will go Stand to Sit: with supervision PT Goal: Stand to Sit - Progress: Goal set today Pt will Ambulate: 51 - 150 feet;with supervision;with rolling walker PT Goal: Ambulate - Progress: Goal set today Pt will Perform Home Exercise Program: with supervision, verbal cues required/provided PT Goal: Perform Home Exercise Program - Progress: Goal set today Additional Goals Additional Goal #1: demo/stat posterior hip precautions PT Goal: Additional Goal #1 - Progress: Goal set today  Visit Information  Last PT Received On: 11/21/11 Assistance Needed: +2    Subjective Data  Subjective: It is sore Patient Stated Goal: I will try to get up.   Prior Functioning  Home Living Lives With: Alone Available Help at Discharge: Skilled Nursing Facility Type of Home: Apartment Home  Access: Level entry Home Layout: One level Home Adaptive Equipment: Straight cane Prior  Function Level of Independence: Independent with assistive device(s) Able to Take Stairs?: Yes Driving: Yes Vocation: Retired Musician: No difficulties Dominant Hand: Right    Cognition  Overall Cognitive Status: Appears within functional limits for tasks assessed/performed Arousal/Alertness: Awake/alert Orientation Level: Appears intact for tasks assessed Behavior During Session: [facility] for tasks performed    Extremity/Trunk Assessment Right Lower Extremity Assessment RLE ROM/Strength/Tone: [facility] for tasks assessed Left Lower Extremity Assessment LLE ROM/Strength/Tone: Deficits LLE ROM/Strength/Tone Deficits: pt tolerated about 70 degrees of hip flexion, required assistance to move leg to edge. Pt. did place 50% weight for walking.   Balance    End of Session PT - End of Session Activity Tolerance: Patient limited by fatigue;Patient limited by pain;Patient tolerated treatment well (limited by dizziness.) Patient left: in chair;with call bell/phone within reach;with family/visitor present Nurse Communication: Mobility status  GP     Rada Hay 11/21/2011, 10:42 AM  [phone]

## 2011-11-21 NOTE — Progress Notes (Signed)
   Subjective: 1 Day Post-Op Procedure(s) (LRB): TOTAL HIP ARTHROPLASTY (Left) Patient reports pain as mild.   Patient seen in rounds without Dr. Darrelyn Hillock. Patient is well, and has had no acute complaints or problems. She reports that she did sleep pretty well last night. No issues overnight. She denies shortness of breath and chest pain. She reports that she did eat a little breakfast but her appetite is poor.  We will start therapy today.  Plan is to go Skilled nursing facility after hospital stay.  Objective: Vital signs in last 24 hours: Temp:  [95 F (35 C)-98.3 F (36.8 C)] 98.3 F (36.8 C) (11/13 0617) Pulse Rate:  [42-85] 56  (11/13 0617) Resp:  [10-18] 16  (11/13 0617) BP: (91-146)/(40-89) 116/59 mmHg (11/13 0617) SpO2:  [96 %-100 %] 99 % (11/13 0617) Weight:  [87.544 kg (193 lb)] 87.544 kg (193 lb) (11/12 1704)  Intake/Output from previous day:  Intake/Output Summary (Last 24 hours) at 11/21/11 0841 Last data filed at 11/21/11 0700  Gross per 24 hour  Intake   3800 ml  Output    800 ml  Net   3000 ml     Labs:  [facility] 11/21/11 0555  HGB 8.6*    Basename 11/21/11 0555  WBC 9.9  RBC 3.05*  HCT 26.4*  PLT 273    Basename 11/21/11 0555  NA 137  K 4.3  CL 101  CO2 28  BUN 22  CREATININE 1.43*  GLUCOSE 110*  CALCIUM 10.1   EXAM General - Patient is Alert and Oriented Extremity - Neurologically intact Neurovascular intact Dorsiflexion/Plantar flexion intact Dressing - dressing C/D/I Motor Function - intact, moving foot and toes well on exam.    Past Medical History  Diagnosis Date  . Asthma   . GERD (gastroesophageal reflux disease)   . Hyperlipidemia   . Hypertension   . Osteoporosis   . COPD (chronic obstructive pulmonary disease)   . Allergy   . Anemia   . Diverticulosis   . Tubular adenoma of colon 10/01    Assessment/Plan: 1 Day Post-Op Procedure(s) (LRB): TOTAL HIP ARTHROPLASTY (Left) Active Problems:  Osteoarthritis of left  hip  Postoperative anemia due to acute blood loss  Estimated Body mass index is 31.15 kg/(m^2) as calculated from the following:   Height as of this encounter: 5\' 6" (1.676 m).   Weight as of this encounter: 193 lb(87.544 kg). Advance diet Up with therapy Discharge to SNF tentative Friday  DVT Prophylaxis - Xarelto 50% PWB  Hemovac Pulled Begin Therapy Hip Preacutions  Will recheck H&H this afternoon. Remove foley once up. Will go ahead and give a dose of Miralax to try to stay in front of constipation. Social work will speak with her today regarding placement into SNF.   Aubrei Bouchie LAUREN 11/21/2011, 8:41 AM

## 2011-11-21 NOTE — Progress Notes (Signed)
Physical Therapy Treatment Patient Details Name: Heidi Hoffman MRN: [id number] DOB: 18-Apr-1932 Today's Date: 11/21/2011 Time: 4098-1191 PT Time Calculation (min): 13 min  PT Assessment / Plan / Recommendation Comments on Treatment Session  pt. tolerated standing and pivoting  to bed.    Follow Up Recommendations  SNF     Does the patient have the potential to tolerate intense rehabilitation     Barriers to Discharge        Equipment Recommendations  Rolling walker with 5" wheels;3 in 1 bedside comode    Recommendations for Other Services    Frequency 7X/week   Plan Discharge plan remains appropriate;Frequency remains appropriate    Precautions / Restrictions Precautions Precautions: Posterior Hip Precaution Comments: posted    Pertinent Vitals/Pain L hip6/10 had meds    Mobility  Bed Mobility Bed Mobility: Sit to Supine Sit to Supine: 1: +2 Total assist Sit to Supine: Patient Percentage: 40% Details for Bed Mobility Assistance: asssit for LLE onto bed. Transfers Transfers: Sit to Stand;Stand to Sit Sit to Stand: 1: +2 Total assist;From chair/3-in-1;With armrests Sit to Stand: Patient Percentage: 60% Stand to Sit: To bed;1: +2 Total assist Stand to Sit: Patient Percentage: 60% Details for Transfer Assistance: MIn VCs for hand placement. Physical A needed to rise, stabilize and control descent Ambulation/Gait Ambulation/Gait Assistance: 1: +2 Total assist Ambulation/Gait: Patient Percentage: 70% Ambulation Distance (Feet): 5 Feet Assistive device: Rolling walker Gait Pattern: Step-to pattern    Exercises     PT Diagnosis:    PT Problem List:   PT Treatment Interventions:     PT Goals Acute Rehab PT Goals Pt will go Sit to Supine/Side: with supervision PT Goal: Sit to Supine/Side - Progress: Progressing toward goal Pt will go Sit to Stand: with supervision PT Goal: Sit to Stand - Progress: Progressing toward goal Pt will go Stand to Sit: with  supervision PT Goal: Stand to Sit - Progress: Progressing toward goal  Visit Information  Last PT Received On: 11/21/11 Assistance Needed: +2    Subjective Data  Subjective: ready to lie down   Cognition  Overall Cognitive Status: Appears within functional limits for tasks assessed/performed Arousal/Alertness: Awake/alert Orientation Level: Appears intact for tasks assessed Behavior During Session: [facility] for tasks performed    Balance     End of Session PT - End of Session Activity Tolerance: Patient tolerated treatment well Patient left: in bed;with call bell/phone within reach;with family/visitor present Nurse Communication: Mobility status   GP     Rada Hay 11/21/2011, 4:08 PM

## 2011-11-21 NOTE — Evaluation (Signed)
Occupational Therapy Evaluation Patient Details Name: Heidi Hoffman MRN: [id number] DOB: Dec 09, 1932 Today's Date: 11/21/2011 Time: 5284-1324 OT Time Calculation (min): 36 min  OT Assessment / Plan / Recommendation Clinical Impression  Pt is a 78 yo female who presents POD 1 LTHR. Skilled OT recommended to maximize independence with BADLs to minguard/min A level in prep for d/c to next venue of care.    OT Assessment  Patient needs continued OT Services    Follow Up Recommendations  SNF    Barriers to Discharge Decreased caregiver support    Equipment Recommendations  Rolling walker with 5" wheels;3 in 1 bedside comode    Recommendations for Other Services    Frequency  Min 1X/week    Precautions / Restrictions Precautions Precautions: Posterior Hip Restrictions Weight Bearing Restrictions: No LLE Weight Bearing: Partial weight bearing LLE Partial Weight Bearing Percentage or Pounds: 50%   Pertinent Vitals/Pain Pt reported 8/10 pain after transferring to chair. Repositioned for comfort.    ADL  Grooming: Simulated;Set up Where Assessed - Grooming: Unsupported sitting Upper Body Bathing: Simulated;Set up Where Assessed - Upper Body Bathing: Unsupported sitting Lower Body Bathing: Simulated;Maximal assistance Where Assessed - Lower Body Bathing: Supported sit to stand Upper Body Dressing: Simulated;Set up Where Assessed - Upper Body Dressing: Unsupported sitting Lower Body Dressing: Simulated;Maximal assistance Where Assessed - Lower Body Dressing: Supported sit to stand Toilet Transfer: Simulated;+2 Total assistance Toilet Transfer: Patient Percentage: 60% Statistician Method: Sit to Barista: Other (comment) Nurse, children's) Toileting - Clothing Manipulation and Hygiene: Simulated;Maximal assistance Where Assessed - Engineer, mining and Hygiene: Standing Equipment Used: Rolling walker Transfers/Ambulation Related to ADLs: Pt took  only a few steps before becoming fatigued. Audible wheeze noted at rest. Vitals stable throughout on RA. ADL Comments: Educated pt and daughter on all hip precautions and AE for LB ADLs. Provided handout.    OT Diagnosis: Generalized weakness  OT Problem List: Decreased activity tolerance;Decreased knowledge of use of DME or AE;Pain OT Treatment Interventions: Self-care/ADL training;Therapeutic activities;DME and/or AE instruction;Patient/family education   OT Goals Acute Rehab OT Goals OT Goal Formulation: With patient/family Time For Goal Achievement: 12/05/11 Potential to Achieve Goals: Good ADL Goals Pt Will Perform Grooming: Standing at sink;Other (comment) (w/ minguard A) ADL Goal: Grooming - Progress: Goal set today Pt Will Perform Lower Body Bathing: with min assist;Sit to stand from chair;Sit to stand from bed;with adaptive equipment ADL Goal: Lower Body Bathing - Progress: Goal set today Pt Will Perform Lower Body Dressing: with min assist;Sit to stand from bed;Sit to stand from chair;with adaptive equipment ADL Goal: Lower Body Dressing - Progress: Goal set today Pt Will Transfer to Toilet: Ambulation;Stand pivot transfer;3-in-1;Maintaining hip precautions;Other (comment) (with minguard A) ADL Goal: Toilet Transfer - Progress: Goal set today Pt Will Perform Toileting - Clothing Manipulation: with min assist;Sitting on 3-in-1 or toilet;Standing ADL Goal: Toileting - Clothing Manipulation - Progress: Goal set today Pt Will Perform Toileting - Hygiene: with min assist;Sit to stand from 3-in-1/toilet ADL Goal: Toileting - Hygiene - Progress: Goal set today  Visit Information  Last OT Received On: 11/21/11 Assistance Needed: +2 PT/OT Co-Evaluation/Treatment: Yes    Subjective Data  Subjective: I have asthma. Patient Stated Goal: Not asked   Prior Functioning     Home Living Lives With: Alone Available Help at Discharge: Skilled Nursing Facility Type of Home:  Apartment Home Access: Level entry Home Layout: One level Home Adaptive Equipment: Straight cane Prior Function Level of Independence: Independent with assistive  device(s) Able to Take Stairs?: Yes Driving: Yes Vocation: Retired Musician: No difficulties Dominant Hand: Right         Vision/Perception     Cognition  Overall Cognitive Status: Appears within functional limits for tasks assessed/performed Arousal/Alertness: Awake/alert Orientation Level: Appears intact for tasks assessed Behavior During Session: [facility] for tasks performed    Extremity/Trunk Assessment Right Upper Extremity Assessment RUE ROM/Strength/Tone: [facility] for tasks assessed Left Upper Extremity Assessment LUE ROM/Strength/Tone: WFL for tasks assessed Right Lower Extremity Assessment RLE ROM/Strength/Tone: [facility] for tasks assessed Left Lower Extremity Assessment LLE ROM/Strength/Tone: Deficits LLE ROM/Strength/Tone Deficits: pt tolerated about 70 degrees of hip flexion, required assistance to move leg to edge. Pt. did place 50% weight for walking.     Mobility Bed Mobility Bed Mobility: Supine to Sit Supine to Sit: 3: Mod assist;HOB elevated;With rails Details for Bed Mobility Assistance: VC for technique and hip precautions as pt moved to edge., Transfers Sit to Stand: 1: +2 Total assist;From bed;From elevated surface;With upper extremity assist Sit to Stand: Patient Percentage: 60% Stand to Sit: 4: Min assist;With armrests;To chair/3-in-1 Details for Transfer Assistance: MIn VCs for hand placement. Physical A needed to rise, stabilize and control descent.     Shoulder Instructions     Exercise    Balance     End of Session OT - End of Session Activity Tolerance: Patient limited by fatigue Patient left: in chair;with call bell/phone within reach;with family/visitor present Nurse Communication: Mobility status  GO     Ishmail Mcmanamon A OTR/L [phone] 11/21/2011, 12:30  PM

## 2011-11-21 NOTE — Progress Notes (Signed)
Clinical Social Work Department BRIEF PSYCHOSOCIAL ASSESSMENT 11/21/2011  Patient:  Heidi Hoffman, Heidi Hoffman     Account Number:  [id number]     Admit date:  11/20/2011  Clinical Social Worker:  Orpah Greek  Date/Time:  11/21/2011 03:23 PM  Referred by:  Physician  Date Referred:  11/21/2011 Referred for  SNF Placement   Other Referral:   Interview type:  Patient Other interview type:   and family at bedside    PSYCHOSOCIAL DATA Living Status:  ALONE Admitted from facility:   Level of care:   Primary support name:  Heidi Hoffman (son) Ph#: [phone] Primary support relationship to patient:  CHILD, ADULT Degree of support available:   good    CURRENT CONCERNS Current Concerns  Post-Acute Placement   Other Concerns:    SOCIAL WORK ASSESSMENT / PLAN CSW spoke with patient & family at bedside - Heidi Hoffman (daughter - [phone]) and Heidi Hoffman (son - [phone]) re: discharge planning. Patient states that she has never been to a facility before but is agreeable with going.   Assessment/plan status:  Information/Referral to [facility] Other assessment/ plan:   Information/referral to community resources:   CSW completed FL2 and faxed information out to [facility] - provided bed offers.    PATIENT'S/FAMILY'S RESPONSE TO PLAN OF CARE: Patient requesting [facility] SNF in [place]. CSW spoke with Carney Bern @ facility - confirmed that they will have a bed available for her at discharge.        Unice Bailey, LCSW [facility] Clinical Social Worker cell #: [phone]

## 2011-11-21 NOTE — Progress Notes (Signed)
Utilization review completed.  

## 2011-11-22 LAB — BASIC METABOLIC PANEL
BUN: 16 mg/dL (ref 6–23)
CO2: 26 mEq/L (ref 19–32)
Calcium: 9.7 mg/dL (ref 8.4–10.5)
Chloride: 102 mEq/L (ref 96–112)
Creatinine, Ser: 1.09 mg/dL (ref 0.50–1.10)
GFR calc Af Amer: 56 mL/min — ABNORMAL LOW (ref >90)
GFR calc non Af Amer: 48 mL/min — ABNORMAL LOW (ref >90)
Glucose, Bld: 93 mg/dL (ref 70–99)
Potassium: 3.7 mEq/L (ref 3.5–5.1)
Sodium: 136 mEq/L (ref 135–145)

## 2011-11-22 LAB — CBC
HCT: 22.3 % — ABNORMAL LOW (ref 36.0–46.0)
Hemoglobin: 7.4 g/dL — ABNORMAL LOW (ref 12.0–15.0)
MCH: 28.5 pg (ref 26.0–34.0)
MCHC: 33.2 g/dL (ref 30.0–36.0)
MCV: 85.8 fL (ref 78.0–100.0)
Platelets: 230 10*3/uL (ref 150–400)
RBC: 2.6 MIL/uL — ABNORMAL LOW (ref 3.87–5.11)
RDW: 14.3 % (ref 11.5–15.5)
WBC: 9.2 10*3/uL (ref 4.0–10.5)

## 2011-11-22 LAB — HEMOGLOBIN AND HEMATOCRIT, BLOOD
HCT: 22.1 % — ABNORMAL LOW (ref 36.0–46.0)
Hemoglobin: 7.6 g/dL — ABNORMAL LOW (ref 12.0–15.0)

## 2011-11-22 MED ORDER — LACTATED RINGERS IV BOLUS (SEPSIS)
250.0000 mL | Freq: Once | INTRAVENOUS | Status: AC
  Administered 2011-11-22: 250 mL via INTRAVENOUS

## 2011-11-22 NOTE — Progress Notes (Signed)
Physical Therapy Treatment Patient Details Name: Heidi Hoffman MRN: [id number] DOB: 1932/10/19 Today's Date: 11/22/2011 Time: 7829-5621 PT Time Calculation (min): 28 min  PT Assessment / Plan / Recommendation Comments on Treatment Session  POD # 3 L THR with HgB 7.4 this am pt asymptomatic.  MD order for PT to Tx today and will re draw again this afternoon.  Pt plans to D/C to [facility] for ST Rehab.    Follow Up Recommendations  SNF     Does the patient have the potential to tolerate intense rehabilitation     Barriers to Discharge        Equipment Recommendations  Rolling walker with 5" wheels;3 in 1 bedside comode    Recommendations for Other Services    Frequency 7X/week   Plan Discharge plan remains appropriate    Precautions / Restrictions Precautions Precautions: Posterior Hip Precaution Comments: Pt recalls 1/3 THP so re educated and demonstarted Restrictions Weight Bearing Restrictions: Yes LLE Weight Bearing: Partial weight bearing LLE Partial Weight Bearing Percentage or Pounds: Instructed and demonstrated PWB    Pertinent Vitals/Pain C/o 3/10 with amb    Mobility  Bed Mobility Bed Mobility: Supine to Sit;Sitting - Scoot to Edge of Bed Supine to Sit: 3: Mod assist;2: Max assist Sitting - Scoot to [place] of Bed: 3: Mod assist;2: Max assist Details for Bed Mobility Assistance: increased time and 25% VC's on proper technique  Transfers Transfers: Sit to Stand;Stand to Sit Sit to Stand: 1: +2 Total assist;From bed;From toilet Sit to Stand: Patient Percentage: 60% Stand to Sit: 1: +2 Total assist;To toilet;To chair/3-in-1 Stand to Sit: Patient Percentage: 60% Details for Transfer Assistance: 50% VC's on proper tech, hand placement and advancement of L LE prior to sit/stand to avoid hip flex > 90'  Ambulation/Gait Ambulation/Gait Assistance: 1: +2 Total assist Ambulation/Gait: Patient Percentage: 70% Ambulation Distance (Feet): 45 Feet Assistive device:  Rolling walker Ambulation/Gait Assistance Details: 50% VC's on proper sequencing and PWB Gait Pattern: Step-to pattern;Decreased stance time - left;Decreased stride length Gait velocity: decreased    Exercises Total Joint Exercises Ankle Circles/Pumps: AROM;Both;10 reps;Seated Quad Sets: AROM;Both;10 reps;Seated Gluteal Sets: AROM;Both;10 reps;Seated    PT Goals                                                progressing    Visit Information  Last PT Received On: 11/22/11 Assistance Needed: +2    Subjective Data   "It's not as bad as I thought it would be (walking)".   Cognition    good   Balance   fair  End of Session PT - End of Session Equipment Utilized During Treatment: Gait belt Activity Tolerance: Patient tolerated treatment well Patient left: in chair;with call bell/phone within reach;with family/visitor present Nurse Communication: Mobility status   Felecia Shelling  PTA [facility]  Acute  Rehab Pager     [phone]

## 2011-11-22 NOTE — Progress Notes (Signed)
   Subjective: 2 Days Post-Op Procedure(s) (LRB): TOTAL HIP ARTHROPLASTY (Left) Patient reports pain as mild.   Patient seen in rounds without Dr. Darrelyn Hillock. Patient is well, and has had no acute complaints or problems. No issues overnight. She reports that therapy was hard yesterday but "it went ok". She denies chest pain, shortness of breath, and dizziness. She is hopeful to be discharged to SNF tomorrow. Voiding well. Positive flatus.  Plan is to go Skilled nursing facility after hospital stay.  Objective: Vital signs in last 24 hours: Temp:  [98.1 F (36.7 C)-99.8 F (37.7 C)] 99.7 F (37.6 C) (11/14 0451) Pulse Rate:  [67-96] 67  (11/14 0451) Resp:  [18-19] 19  (11/14 0451) BP: (124-152)/(49-71) 124/49 mmHg (11/14 0451) SpO2:  [99 %-100 %] 99 % (11/14 0451)  Intake/Output from previous day:  Intake/Output Summary (Last 24 hours) at 11/22/11 0756 Last data filed at 11/22/11 0400  Gross per 24 hour  Intake   3180 ml  Output   1000 ml  Net   2180 ml     Labs:  [facility] 11/22/11 0450 11/21/11 1510 11/21/11 0555  HGB 7.4* 7.7* 8.6*    Basename 11/22/11 0450 11/21/11 1510 11/21/11 0555  WBC 9.2 -- 9.9  RBC 2.60* -- 3.05*  HCT 22.3* 23.1* --  PLT 230 -- 273    Basename 11/22/11 0450 11/21/11 0555  NA 136 137  K 3.7 4.3  CL 102 101  CO2 26 28  BUN 16 22  CREATININE 1.09 1.43*  GLUCOSE 93 110*  CALCIUM 9.7 10.1    EXAM General - Patient is Alert and Oriented Extremity - Neurologically intact Neurovascular intact Dorsiflexion/Plantar flexion intact No cellulitis present Dressing/Incision - clean, dry, no drainage Motor Function - intact, moving foot and toes well on exam.   Past Medical History  Diagnosis Date  . Asthma   . GERD (gastroesophageal reflux disease)   . Hyperlipidemia   . Hypertension   . Osteoporosis   . COPD (chronic obstructive pulmonary disease)   . Allergy   . Anemia   . Diverticulosis   . Tubular adenoma of colon 10/01     Assessment/Plan: 2 Days Post-Op Procedure(s) (LRB): TOTAL HIP ARTHROPLASTY (Left) Active Problems:  Osteoarthritis of left hip  Postoperative anemia due to acute blood loss  Estimated Body mass index is 31.15 kg/(m^2) as calculated from the following:   Height as of this encounter: 5\' 6" (1.676 m).   Weight as of this encounter: 193 lb(87.544 kg). Advance diet Up with therapy Plan for discharge tomorrow to SNF  DVT Prophylaxis - Xarelto 50% PWB left leg  Hgb is 7.4 this morning. Patient currently asymptomatic. Will continue to monitor. Recheck Hgb this afternoon. If Hgb not increased then will transfuse two units of blood. Discussed plan with patient this morning. Type and screen ordered this morning in preparation. Patient can work with therapy today. Plan for discharge to [facility] SNF tomorrow.   Axel Meas LAUREN 11/22/2011, 7:56 AM

## 2011-11-23 DIAGNOSIS — D62 Acute posthemorrhagic anemia: Secondary | ICD-10-CM | POA: Diagnosis not present

## 2011-11-23 LAB — CBC
Hemoglobin: 9.2 g/dL — ABNORMAL LOW (ref 12.0–15.0)
MCH: 28.1 pg (ref 26.0–34.0)
MCH: 29.8 pg (ref 26.0–34.0)
MCHC: 32.7 g/dL (ref 30.0–36.0)
MCHC: 35.1 g/dL (ref 30.0–36.0)
Platelets: 201 10*3/uL (ref 150–400)
RDW: 14.3 % (ref 11.5–15.5)
RDW: 14.2 % (ref 11.5–15.5)

## 2011-11-23 LAB — PREPARE RBC (CROSSMATCH)

## 2011-11-23 MED ORDER — RIVAROXABAN 10 MG PO TABS: 10.0000 mg | ORAL_TABLET | Freq: Every day | ORAL | Status: DC

## 2011-11-23 MED ORDER — OXYCODONE-ACETAMINOPHEN 5-325 MG PO TABS: 2.0000 | ORAL_TABLET | ORAL | Status: DC | PRN

## 2011-11-23 MED ORDER — METHOCARBAMOL 500 MG PO TABS: 500.0000 mg | ORAL_TABLET | Freq: Four times a day (QID) | ORAL | Status: DC | PRN

## 2011-11-23 MED ORDER — BISACODYL 10 MG RE SUPP: 10.0000 mg | Freq: Every day | RECTAL | Status: DC | PRN

## 2011-11-23 MED ORDER — FERROUS SULFATE 325 (65 FE) MG PO TABS: 325.0000 mg | ORAL_TABLET | Freq: Three times a day (TID) | ORAL | Status: DC

## 2011-11-23 NOTE — Progress Notes (Signed)
Subjective: Doing fine except for slight Shortness of breath. Hbg is 7.0 and she is elderly and it will be physicial demanding with therapy,so will transfuse today. CBC tomorrow prior to SNF transfer.   Objective: Vital signs in last 24 hours: Temp:  [98.6 F (37 C)-99.8 F (37.7 C)] 98.6 F (37 C) (11/15 0522) Pulse Rate:  [85-98] 88  (11/15 0522) Resp:  [20] 20  (11/15 0522) BP: (117-147)/(50-62) 134/60 mmHg (11/15 0522) SpO2:  [96 %-100 %] 96 % (11/15 0522)  Intake/Output from previous day: 11/14 0701 - 11/15 0700 In: 2215 [P.O.:660; I.V.:1555] Out: 600 [Urine:600] Intake/Output this shift:     Basename 11/23/11 0450 11/22/11 1439 11/22/11 0450 11/21/11 1510 11/21/11 0555  HGB 7.0* 7.6* 7.4* 7.7* 8.6*    Basename 11/23/11 0450 11/22/11 1439 11/22/11 0450  WBC 9.7 -- 9.2  RBC 2.49* -- 2.60*  HCT 21.4* 22.1* --  PLT 226 -- 230    Basename 11/22/11 0450 11/21/11 0555  NA 136 137  K 3.7 4.3  CL 102 101  CO2 26 28  BUN 16 22  CREATININE 1.09 1.43*  GLUCOSE 93 110*  CALCIUM 9.7 10.1   No results found for this basename: LABPT:2,INR:2 in the last 72 hours  Neurovascular intact  Assessment/Plan: Transfuse today and SNF tomorrow.   Heidi Hoffman A 11/23/2011, 7:48 AM

## 2011-11-23 NOTE — Progress Notes (Signed)
Spoke with Pt's daughter re: her concerns with [facility].  Pt's daughter stating that she needs more time to make a decision for her mom's d/c.  Explained to Pt's daughter that since a d/c summary has been written for tomorrow, there's a chance that [employer] won't pay past tomorrow.  Pt's daughter to discuss this with the family and call CSW back.  Spoke with RNCM.  Per RNCM, [employer] will not pay past the d/c date and, if the Pt remains past the d/c date, a Medicare Letter of Hospital Non-coverage will be issued.    Relayed this information to Pt's daughter.  Pt's daughter stated that she is going to [facility] to look at the [facility] and that she will call CSW back.  CSW explained that CSW leaves for the day at 1630.  No call from Pt's daughter.  CSW LM for Pt's daughter.  Weekend CSW to f/u.  Providence Crosby, LCSWA Clinical Social Work [phone]

## 2011-11-23 NOTE — Progress Notes (Signed)
Physical Therapy Treatment Patient Details Name: Heidi Hoffman MRN: [id number] DOB: [birth year] Today's Date: 11/23/2011 Time: 1443-1510 PT Time Calculation (min): 27 min  PT Assessment / Plan / Recommendation Comments on Treatment Session  Pt s/p one unit of PRBCs. Pt with good progress today.  Pt able to increase ambulation distance and performed exercises.  Pt plans to d/c to SNF tomorrow.    Follow Up Recommendations  SNF     Does the patient have the potential to tolerate intense rehabilitation     Barriers to Discharge        Equipment Recommendations  Rolling walker with 5" wheels;3 in 1 bedside comode    Recommendations for Other Services    Frequency     Plan Discharge plan remains appropriate    Precautions / Restrictions Precautions Precautions: Posterior Hip Precaution Comments: Pt able to recall 2/3 THP Restrictions Weight Bearing Restrictions: Yes LLE Weight Bearing: Partial weight bearing   Pertinent Vitals/Pain 3-4/10 L hip pain, pt declines notifying RN for meds, repositioned in recliner    Mobility  Bed Mobility Bed Mobility: Supine to Sit Supine to Sit: 4: Min assist Details for Bed Mobility Assistance: verbal cues for technique and precautions, assist for L LE Transfers Transfers: Sit to Stand;Stand to Sit;Stand Pivot Transfers Sit to Stand: 4: Min assist;With upper extremity assist;From bed;From chair/3-in-1 Stand to Sit: 4: Min assist;With upper extremity assist;To chair/3-in-1 Stand Pivot Transfers: 4: Min guard Details for Transfer Assistance: verbal cues for safe technique within precautions, verbal cues for not bending too far forward with hygiene since stand pivot to [facility] Ambulation/Gait Ambulation/Gait Assistance: 4: Min guard Ambulation Distance (Feet): 140 Feet Assistive device: Rolling walker Ambulation/Gait Assistance Details: verbal cues for sequence, RW distance, PWB Gait Pattern: Step-to pattern;Decreased stance time -  left;Decreased stride length Gait velocity: decreased    Exercises Total Joint Exercises Ankle Circles/Pumps: AROM;15 reps;Both Quad Sets: AROM;15 reps;Both Gluteal Sets: AROM;15 reps;Both Short Arc Quad: AROM;Left;15 reps Heel Slides: AAROM;Left;15 reps;Other (comment) (within precautions) Hip ABduction/ADduction: AAROM;Left;15 reps Long Arc Quad: AROM;15 reps;Left   PT Diagnosis:    PT Problem List:   PT Treatment Interventions:     PT Goals Acute Rehab PT Goals PT Goal: Supine/Side to Sit - Progress: Progressing toward goal PT Goal: Sit to Stand - Progress: Progressing toward goal PT Goal: Stand to Sit - Progress: Progressing toward goal PT Goal: Ambulate - Progress: Progressing toward goal PT Goal: Perform Home Exercise Program - Progress: Progressing toward goal  Visit Information  Last PT Received On: 11/23/11 Assistance Needed: +1    Subjective Data  Subjective: I'm feeling better today, less pain.   Cognition  Overall Cognitive Status: Appears within functional limits for tasks assessed/performed    Balance     End of Session PT - End of Session Activity Tolerance: Patient tolerated treatment well Patient left: in chair;with call bell/phone within reach;with family/visitor present   GP     Heidi Hoffman,KATHrine E 11/23/2011, 4:04 PM Pager: [phone]

## 2011-11-23 NOTE — Progress Notes (Signed)
Facility able to accept Pt on Saturday, however they will need for her family to sign her in today and are requesting the d/c summary.  Sent Pt's d/c summary.  Contacted Pt's son, Roger Shelter, who stated that he's not able today to sign Pt in today but that his sister is at the hospital and she might be available.  Spoke with Pt's daughter.  Pt's daughter stated that a brother is on the way to [facility] to tour the facility and sign Pt in.  CSW thanked her for her time.  CSW notified facility of the above information.  CSW weekend to facilitate d/c tomorrow (Sat).  CSW to continue to follow.  Providence Crosby, LCSWA Clinical Social Work [phone]

## 2011-11-23 NOTE — Discharge Summary (Addendum)
Physician Discharge Summary   Patient ID: Heidi Hoffman MRN: [id number] DOB/AGE: 10-Jun-1932 78 y.o.  Admit date: 11/20/2011 Discharge date: 11/26/2011  Primary Diagnosis: Osteoarthritis, left hip  Admission Diagnoses:  Past Medical History  Diagnosis Date  . Asthma   . GERD (gastroesophageal reflux disease)   . Hyperlipidemia   . Hypertension   . Osteoporosis   . COPD (chronic obstructive pulmonary disease)   . Allergy   . Anemia   . Diverticulosis   . Tubular adenoma of colon 10/01   Discharge Diagnoses:   Active Problems:  Osteoarthritis of left hip  Postoperative anemia due to acute blood loss  Acute blood loss anemia S/p left total hip arthroplasty  Estimated Body mass index is 31.15 kg/(m^2) as calculated from the following:   Height as of this encounter: 5\' 6" (1.676 m).   Weight as of this encounter: 193 lb(87.544 kg).  Classification of overweight in adults according to BMI (WHO, 1998)   Procedure: Procedure(s) (LRB): TOTAL HIP ARTHROPLASTY (Left)   Consults: None  HPI: Heidi Hoffman, 78 y.o. female, has a history of pain and functional disability in the left hip(s) due to arthritis and avascular necrosis and patient has failed non-surgical conservative treatments for greater than 12 weeks to include NSAID's and/or analgesics, use of assistive devices and activity modification. Onset of symptoms was abrupt starting 6 months ago with gradually worsening course since that time.The patient noted no past surgery on the left hip(s). Patient currently rates pain in the left hip at 8 out of 10 with activity. Patient has night pain, worsening of pain with activity and weight bearing, pain that interfers with activities of daily living and pain with passive range of motion. Patient has evidence of joint space narrowing and necrosis of the femoral head by imaging studies. This condition presents safety issues increasing the risk of falls. There is no current active  infection.   Laboratory Data: Admission on 11/20/2011  Component Date Value Range Status  . ABO/RH(D) 11/20/2011 A POS   Final  . Antibody Screen 11/20/2011 NEG   Final  . Sample Expiration 11/20/2011 11/23/2011   Final  . ABO/RH(D) 11/20/2011 A POS   Final  . WBC 11/21/2011 9.9  4.0 - 10.5 K/uL Final  . RBC 11/21/2011 3.05* 3.87 - 5.11 MIL/uL Final  . Hemoglobin 11/21/2011 8.6* 12.0 - 15.0 g/dL Final  . HCT 82/95/6213 26.4* 36.0 - 46.0 % Final  . MCV 11/21/2011 86.6  78.0 - 100.0 fL Final  . MCH 11/21/2011 28.2  26.0 - 34.0 pg Final  . MCHC 11/21/2011 32.6  30.0 - 36.0 g/dL Final  . RDW 08/65/7846 14.7  11.5 - 15.5 % Final  . Platelets 11/21/2011 273  150 - 400 K/uL Final  . Sodium 11/21/2011 137  135 - 145 mEq/L Final  . Potassium 11/21/2011 4.3  3.5 - 5.1 mEq/L Final  . Chloride 11/21/2011 101  96 - 112 mEq/L Final  . CO2 11/21/2011 28  19 - 32 mEq/L Final  . Glucose, Bld 11/21/2011 110* 70 - 99 mg/dL Final  . BUN 96/29/5284 22  6 - 23 mg/dL Final  . Creatinine, Ser 11/21/2011 1.43* 0.50 - 1.10 mg/dL Final  . Calcium 13/24/4010 10.1  8.4 - 10.5 mg/dL Final  . GFR calc non Af Amer 11/21/2011 35* >90 mL/min Final  . GFR calc Af Amer 11/21/2011 40* >90 mL/min Final   Comment:  The eGFR has been calculated                          using the CKD EPI equation.                          This calculation has not been                          validated in all clinical                          situations.                          eGFR's persistently                          <90 mL/min signify                          possible Chronic Kidney Disease.  Marland Kitchen Hemoglobin 11/21/2011 7.7* 12.0 - 15.0 g/dL Final  . HCT 16/10/9602 23.1* 36.0 - 46.0 % Final  . WBC 11/22/2011 9.2  4.0 - 10.5 K/uL Final  . RBC 11/22/2011 2.60* 3.87 - 5.11 MIL/uL Final  . Hemoglobin 11/22/2011 7.4* 12.0 - 15.0 g/dL Final  . HCT 54/09/8117 22.3* 36.0 - 46.0 % Final  . MCV 11/22/2011  85.8  78.0 - 100.0 fL Final  . MCH 11/22/2011 28.5  26.0 - 34.0 pg Final  . MCHC 11/22/2011 33.2  30.0 - 36.0 g/dL Final  . RDW 14/78/2956 14.3  11.5 - 15.5 % Final  . Platelets 11/22/2011 230  150 - 400 K/uL Final  . Sodium 11/22/2011 136  135 - 145 mEq/L Final  . Potassium 11/22/2011 3.7  3.5 - 5.1 mEq/L Final  . Chloride 11/22/2011 102  96 - 112 mEq/L Final  . CO2 11/22/2011 26  19 - 32 mEq/L Final  . Glucose, Bld 11/22/2011 93  70 - 99 mg/dL Final  . BUN 21/30/8657 16  6 - 23 mg/dL Final  . Creatinine, Ser 11/22/2011 1.09  0.50 - 1.10 mg/dL Final  . Calcium 84/69/6295 9.7  8.4 - 10.5 mg/dL Final  . GFR calc non Af Amer 11/22/2011 48* >90 mL/min Final  . GFR calc Af Amer 11/22/2011 56* >90 mL/min Final   Comment:                                 The eGFR has been calculated                          using the CKD EPI equation.                          This calculation has not been                          validated in all clinical                          situations.  eGFR's persistently                          <90 mL/min signify                          possible Chronic Kidney Disease.  Marland Kitchen Hemoglobin 11/22/2011 7.6* 12.0 - 15.0 g/dL Final  . HCT 78/46/9629 22.1* 36.0 - 46.0 % Final  . WBC 11/23/2011 9.7  4.0 - 10.5 K/uL Final  . RBC 11/23/2011 2.49* 3.87 - 5.11 MIL/uL Final  . Hemoglobin 11/23/2011 7.0* 12.0 - 15.0 g/dL Final  . HCT 52/84/1324 21.4* 36.0 - 46.0 % Final  . MCV 11/23/2011 85.9  78.0 - 100.0 fL Final  . MCH 11/23/2011 28.1  26.0 - 34.0 pg Final  . MCHC 11/23/2011 32.7  30.0 - 36.0 g/dL Final  . RDW 40/10/2723 14.3  11.5 - 15.5 % Final  . Platelets 11/23/2011 226  150 - 400 K/uL Final  Hospital Outpatient Visit on 11/14/2011  Component Date Value Range Status  . MRSA, PCR 11/14/2011 NEGATIVE  NEGATIVE Final  . Staphylococcus aureus 11/14/2011 NEGATIVE  NEGATIVE Final   Comment:                                 The Xpert SA Assay (FDA                           approved for NASAL specimens                          in patients over 34 years of age),                          is one component of                          a comprehensive surveillance                          program.  Test performance has                          been validated by [employer] for patients greater                          than or equal to 67 year old.                          It is not intended                          to diagnose infection nor to                          guide or monitor treatment.  Marland Kitchen aPTT 11/14/2011 35  24 - 37 seconds Final  . Sodium 11/14/2011 138  135 - 145 mEq/L Final  . Potassium 11/14/2011 3.8  3.5 - 5.1 mEq/L Final  . Chloride 11/14/2011 100  96 - 112 mEq/L Final  . CO2 11/14/2011 27  19 - 32 mEq/L Final  . Glucose, Bld 11/14/2011 85  70 - 99 mg/dL Final  . BUN 16/10/9602 16  6 - 23 mg/dL Final  . Creatinine, Ser 11/14/2011 1.03  0.50 - 1.10 mg/dL Final  . Calcium 54/09/8117 11.6* 8.4 - 10.5 mg/dL Final  . Total Protein 11/14/2011 8.4* 6.0 - 8.3 g/dL Final  . Albumin 14/78/2956 4.2  3.5 - 5.2 g/dL Final  . AST 21/30/8657 18  0 - 37 U/L Final  . ALT 11/14/2011 13  0 - 35 U/L Final  . Alkaline Phosphatase 11/14/2011 65  39 - 117 U/L Final  . Total Bilirubin 11/14/2011 0.3  0.3 - 1.2 mg/dL Final  . GFR calc non Af Amer 11/14/2011 51* >90 mL/min Final  . GFR calc Af Amer 11/14/2011 60* >90 mL/min Final   Comment:                                 The eGFR has been calculated                          using the CKD EPI equation.                          This calculation has not been                          validated in all clinical                          situations.                          eGFR's persistently                          <90 mL/min signify                          possible Chronic Kidney Disease.  Marland Kitchen Prothrombin Time 11/14/2011 13.4  11.6 - 15.2 seconds Final  . INR 11/14/2011  1.03  0.00 - 1.49 Final  . Color, Urine 11/14/2011 YELLOW  YELLOW Final  . APPearance 11/14/2011 CLOUDY* CLEAR Final  . Specific Gravity, Urine 11/14/2011 1.019  1.005 - 1.030 Final  . pH 11/14/2011 6.0  5.0 - 8.0 Final  . Glucose, UA 11/14/2011 NEGATIVE  NEGATIVE mg/dL Final  . Hgb urine dipstick 11/14/2011 NEGATIVE  NEGATIVE Final  . Bilirubin Urine 11/14/2011 NEGATIVE  NEGATIVE Final  . Ketones, ur 11/14/2011 NEGATIVE  NEGATIVE mg/dL Final  . Protein, ur 84/69/6295 NEGATIVE  NEGATIVE mg/dL Final  . Urobilinogen, UA 11/14/2011 0.2  0.0 - 1.0 mg/dL Final  . Nitrite 28/41/3244 NEGATIVE  NEGATIVE Final  . Leukocytes, UA 11/14/2011 MODERATE* NEGATIVE Final  . Squamous Epithelial / LPF 11/14/2011 RARE  RARE Final  . WBC, UA 11/14/2011 11-20  <3 WBC/hpf Final  . Bacteria, UA 11/14/2011 MANY* RARE Final  . WBC 11/14/2011 7.2  4.0 - 10.5 K/uL Final  . RBC 11/14/2011 3.97  3.87 - 5.11 MIL/uL Final  . Hemoglobin 11/14/2011 11.1* 12.0 - 15.0 g/dL  Final  . HCT 11/14/2011 33.9* 36.0 - 46.0 % Final  . MCV 11/14/2011 85.4  78.0 - 100.0 fL Final  . MCH 11/14/2011 28.0  26.0 - 34.0 pg Final  . MCHC 11/14/2011 32.7  30.0 - 36.0 g/dL Final  . RDW 27/25/3664 14.5  11.5 - 15.5 % Final  . Platelets 11/14/2011 373  150 - 400 K/uL Final  . Specimen Description 11/14/2011 URINE, CLEAN CATCH   Final  . Special Requests 11/14/2011 NONE   Final  . Culture  Setup Time 11/14/2011 11/15/2011 02:59   Final  . Colony Count 11/14/2011 >=100,000 COLONIES/ML   Final  . Culture 11/14/2011 KLEBSIELLA PNEUMONIAE   Final  . Report Status 11/14/2011 11/16/2011 FINAL   Final  . Organism ID, Bacteria 11/14/2011 KLEBSIELLA PNEUMONIAE   Final     X-Rays:Dg Chest 2 View  11/14/2011  *RADIOLOGY REPORT*  Clinical Data: Preop evaluation  CHEST - 2 VIEW  Comparison: 05/28/8  Findings: Heart size is normal.  No pleural effusion or edema.  No airspace consolidation.  Visualized osseous structures are unremarkable.  IMPRESSION:  1.   No acute cardiopulmonary abnormalities.   Original Report Authenticated By: Signa Kell, M.D.    Dg Hip Portable 1 View Left  11/20/2011  *RADIOLOGY REPORT*  Clinical Data: Postop left hip.  PORTABLE LEFT HIP - 1 VIEW  Comparison: None.  Findings: Post total left hip replacement which appears in satisfactory position on this single projection without complication noted.  IMPRESSION: Post total left hip replacement which appears in satisfactory position on this single projection without complication noted.   Original Report Authenticated By: Lacy Duverney, M.D.     EKG: Orders placed during the hospital encounter of 11/14/11  . EKG 12-LEAD  . EKG 12-LEAD     Hospital Course: Patient was admitted to [facility] and taken to the OR and underwent the above state procedure without complications.  Patient tolerated the procedure well and was later transferred to the recovery room and then to the orthopaedic floor for postoperative care.  They were given PO and IV analgesics for pain control following their surgery.  They were given 24 hours of postoperative antibiotics of  Anti-infectives     Start     Dose/Rate Route Frequency Ordered Stop   11/20/11 1730   ceFAZolin (ANCEF) IVPB 1 g/50 mL premix        1 g 100 mL/hr over 30 Minutes Intravenous Every 6 hours 11/20/11 1659 11/20/11 2359   11/20/11 1401   polymyxin B 500,000 Units, bacitracin 50,000 Units in sodium chloride irrigation 0.9 % 500 mL irrigation  Status:  Discontinued          As needed 11/20/11 1401 11/20/11 1435   11/20/11 1106   ceFAZolin (ANCEF) IVPB 2 g/50 mL premix  Status:  Discontinued        2 g 100 mL/hr over 30 Minutes Intravenous 60 min pre-op 11/20/11 1106 11/20/11 1630         and started on DVT prophylaxis in the form of Xarelto.   PT and OT were ordered for total hip protocol.  The patient was allowed to be WBAT with therapy. Discharge planning was consulted to help with postop disposition and equipment  needs.  Patient had a decent night on the evening of surgery and started to get up OOB with therapy on day one.  Hemovac drain was pulled without difficulty.  The knee immobilizer was removed and discontinued.  Continued to work  with therapy into day two.  Dressing was changed on day two and the incision was clean and dry.  By day three, the patient had progressed with therapy but Hgb continued to drop to 7.0, requiring a blood transfusion of 2 units.  Incision was healing well.  Patient was seen in rounds on post op day four and was ready to go home with Hgb increased after transfusion.   Discharge Medications: Prior to Admission medications   Medication Sig Start Date End Date Taking? Authorizing Provider  fluticasone (FLOVENT HFA) 44 MCG/ACT inhaler Inhale 2 puffs into the lungs 2 (two) times daily. 10/31/11  Yes Bruce Romilda Garret, MD  losartan-hydrochlorothiazide (HYZAAR) 100-25 MG per tablet Take 1 tablet by mouth every morning.   Yes Historical Provider, MD  bisacodyl (DULCOLAX) 10 MG suppository Place 1 suppository (10 mg total) rectally daily as needed. 11/23/11   Laquanda Bick Tamala Ser, PA  ferrous sulfate 325 (65 FE) MG tablet Take 1 tablet (325 mg total) by mouth 3 (three) times daily after meals. 11/23/11   Karion Cudd Tamala Ser, PA  methocarbamol (ROBAXIN) 500 MG tablet Take 1 tablet (500 mg total) by mouth every 6 (six) hours as needed. 11/23/11   Allan Minotti Tamala Ser, PA  oxyCODONE-acetaminophen (PERCOCET/ROXICET) 5-325 MG per tablet Take 2 tablets by mouth every 4 (four) hours as needed (Q4-6 hours PRN). 11/23/11   Delquan Poucher Tamala Ser, PA  rivaroxaban (XARELTO) 10 MG TABS tablet Take 1 tablet (10 mg total) by mouth daily with breakfast. 11/23/11   Kerby Nora, PA    Diet: Cardiac diet Activity:PWB 50%. WBAT in one more week No bending hip over 90 degrees- A "L" Angle Do not cross legs Do not let foot roll inward When turning these patients a pillow should be placed  between the patient's legs to prevent crossing. Patients should have the affected knee fully extended when trying to sit or stand from all surfaces to prevent excessive hip flexion. When ambulating and turning toward the affected side the affected leg should have the toes turned out prior to moving the walker and the rest of patient's body as to prevent internal rotation/ turning in of the leg. Abduction pillows are the most effective way to prevent a patient from not crossing legs or turning toes in at rest. If an abduction pillow is not ordered placing a regular pillow length wise between the patient's legs is also an effective reminder. It is imperative that these precautions be maintained so that the surgical hip does not dislocate. Follow-up:in 2 weeks Disposition - Skilled nursing facility Discharged Condition: fair   Discharge Orders    Future Orders Please Complete By Expires   Diet - low sodium heart healthy      Call MD / Call 911      Comments:   If you experience chest pain or shortness of breath, CALL 911 and be transported to the hospital emergency room.  If you develope a fever above 101 F, pus (white drainage) or increased drainage or redness at the wound, or calf pain, call your surgeon's office.   Constipation Prevention      Comments:   Drink plenty of fluids.  Prune juice may be helpful.  You may use a stool softener, such as Colace (over the counter) 100 mg twice a day.  Use MiraLax (over the counter) for constipation as needed.   Increase activity slowly as tolerated      Discharge instructions      Comments:  Walk with your walker. PWB 50%. WBAT in one additional week Change your dressing daily. Shower only, no tub bath. Call if any temperatures greater than 101 or any wound complications: [phone] during the day and ask for Dr. Jeannetta Ellis nurse, Mackey [facility]. Follow up in office in 2 weeks May resume vitamins and supplements after completion of three weeks of  Xarelto   Driving restrictions      Comments:   No driving   Follow the hip precautions as taught in Physical Therapy      Change dressing      Comments:   You may change your dressing daily with sterile 4 x 4 inch gauze dressing and paper tape.       Medication List     As of 11/23/2011  7:52 AM    STOP taking these medications         acetaminophen 500 MG tablet   Commonly known as: TYLENOL      CALTRATE 600+D PLUS 600-400 MG-UNIT per tablet      multivitamin tablet      TAKE these medications         bisacodyl 10 MG suppository   Commonly known as: DULCOLAX   Place 1 suppository (10 mg total) rectally daily as needed.      ferrous sulfate 325 (65 FE) MG tablet   Take 1 tablet (325 mg total) by mouth 3 (three) times daily after meals.      fluticasone 44 MCG/ACT inhaler   Commonly known as: FLOVENT HFA   Inhale 2 puffs into the lungs 2 (two) times daily.      losartan-hydrochlorothiazide 100-25 MG per tablet   Commonly known as: HYZAAR   Take 1 tablet by mouth every morning.      methocarbamol 500 MG tablet   Commonly known as: ROBAXIN   Take 1 tablet (500 mg total) by mouth every 6 (six) hours as needed.      oxyCODONE-acetaminophen 5-325 MG per tablet   Commonly known as: PERCOCET/ROXICET   Take 2 tablets by mouth every 4 (four) hours as needed (Q4-6 hours PRN).      rivaroxaban 10 MG Tabs tablet   Commonly known as: XARELTO   Take 1 tablet (10 mg total) by mouth daily with breakfast.       Patient was set for discharge on Saturday November 24, 2011. However there was not a bed available. Therefore she was held until Monday November 26, 2011. No issues through the weekend.   SignedCeledonio Savage, Mega Kinkade LAUREN 11/23/2011, 7:52 AM

## 2011-11-24 LAB — TYPE AND SCREEN: Unit division: 0

## 2011-11-24 NOTE — Progress Notes (Signed)
CSW met with the Pt and her daughter concerning SNF placement concerns. Pt and family is wanting to change Pt to a different facility.   Pt's family visited several facilities and stated that they had agreed to go with [facility] versus [facility].   CSW will contact facility tomorrow concerning placement to see if they would be able to give a bed offer. CSW will provided needed documentation for placement.   CSW to follow for d/c planning.   Leron Croak, LCSWA [employer] [phone]

## 2011-11-24 NOTE — Progress Notes (Signed)
Patient ID: Heidi Hoffman, female   DOB: [birth year], 78 y.o.   MRN: [id number] Hgb 9.2.  She is alert with no symptoms of anemia. To SNF today.

## 2011-11-24 NOTE — Progress Notes (Signed)
Patients family does not want patient to go to [facility] for [place], prefers [facility]. SW made aware  Able to talked with patients daughter,Tasha.

## 2011-11-24 NOTE — Progress Notes (Signed)
Physical Therapy Treatment Patient Details Name: Heidi Hoffman MRN: [id number] DOB: 09-30-1932 Today's Date: 11/24/2011 Time: 4098-1191 PT Time Calculation (min): 25 min  PT Assessment / Plan / Recommendation Comments on Treatment Session  Pt making steady progress with increased activity today and less assistance needed with mobility today.    Follow Up Recommendations  SNF           Equipment Recommendations  Rolling walker with 5" wheels;3 in 1 bedside comode       Frequency 7X/week   Plan Discharge plan remains appropriate;Frequency remains appropriate    Precautions / Restrictions Precautions Precautions: Posterior Hip Precaution Comments: Pt able to recall 3/3 hip precautions without cues/assistance today. Restrictions LLE Weight Bearing: Partial weight bearing LLE Partial Weight Bearing Percentage or Pounds: 50% PWB.       Mobility  Bed Mobility Supine to Sit: 4: Min assist;HOB flat;With rails Supine to Sit: Patient Percentage: 80% Sitting - Scoot to Edge of Bed: 4: Min guard Details for Bed Mobility Assistance: min cues for technique and use of UE's to assist with mobility to follow precautions. min assist for left LE management Transfers Sit to Stand: With upper extremity assist;From bed;From chair/3-in-1;With armrests;4: Min guard Stand to Sit: 4: Min guard;To chair/3-in-1;With upper extremity assist;With armrests Stand Pivot Transfers: 4: Min guard Details for Transfer Assistance: min guard assist with all transfers: standing from bed/bsc, sitting to the bsc/chair and with the stand pivot using a walker to go from bed to bsc. min cues for posture and walker use with transfer to bsc. min verbal cues for hand placement with transfers for increased safey with transfer. Ambulation/Gait Ambulation/Gait Assistance: 4: Min guard Ambulation Distance (Feet): 150 Feet Assistive device: Rolling walker Ambulation/Gait Assistance Details: min verbal cues for upright  posture and walker use/placement with gait Gait Pattern: Step-through pattern;Decreased stance time - left;Decreased stride length    Exercises Total Joint Exercises Ankle Circles/Pumps: AROM;Both;10 reps;Supine Hip ABduction/ADduction: AROM;Strengthening;Left;10 reps;Standing Long Arc Quad: AROM;Strengthening;Left;10 reps;Seated Marching in Standing: AROM;Strengthening;Left;10 reps;Standing    PT Goals Acute Rehab PT Goals PT Goal: Supine/Side to Sit - Progress: Progressing toward goal PT Goal: Sit to Stand - Progress: Progressing toward goal PT Goal: Stand to Sit - Progress: Progressing toward goal PT Goal: Ambulate - Progress: Progressing toward goal PT Goal: Perform Home Exercise Program - Progress: Progressing toward goal Additional Goals PT Goal: Additional Goal #1 - Progress: Progressing toward goal  Visit Information  Last PT Received On: 11/24/11 Assistance Needed: +1    Subjective Data  Subjective: No new complaints, agreeable to therapy today. Reports her hip pain as 2-3/10.    Cognition  Overall Cognitive Status: Appears within functional limits for tasks assessed/performed Arousal/Alertness: Awake/alert Orientation Level: Appears intact for tasks assessed Behavior During Session: [facility] for tasks performed       End of Session PT - End of Session Equipment Utilized During Treatment: Gait belt Activity Tolerance: Patient tolerated treatment well Patient left: in chair;with call bell/phone within reach;with family/visitor present Nurse Communication: Mobility status   GP     Sallyanne Kuster 11/24/2011, 1:21 PM  Sallyanne Kuster, PTA Office- [phone]

## 2011-11-25 NOTE — Progress Notes (Signed)
CSW sent FL2 to [facility] and awaiting bed offer for d/c planning.  Weekday CSW to follow-up with placement.   Leron Croak, LCSWA [employer] [phone]

## 2011-11-25 NOTE — Progress Notes (Signed)
CSW contact Jasmine December at [facility] concerning d/c planning to facility and informed her that Pt information was sent via [facility].   Jasmine December will not be in office to look at information until Monday morning and will contact [facility] End[facility] if a bed is available.   Weekday CSW to f/u.   Leron Croak, LCSWA [employer] [phone]

## 2011-11-25 NOTE — Progress Notes (Signed)
CSW was contacted by Carleene Cooper concerning SNF referral to [facility].   CSW verified to family that all information was sent to [facility] this a.m. And am awaiting bed offer.   CSW also needed clarification that [facility] was indeed the chosen facility and not [facility] facility, as  CSW was contacted this a.m. By facility giving bed offer and stating family had chosen that facility.  Family stated that [facility] was the facility the family had chosen.  Family is appreciative and will call to let them know that they have chosen a different facility.  Leron Croak, LCSWA [employer] [phone]

## 2011-11-25 NOTE — Progress Notes (Signed)
Physical Therapy Treatment Patient Details Name: Heidi Hoffman MRN: [id number] DOB: 09-28-1932 Today's Date: 11/25/2011 Time: 7425-9563 PT Time Calculation (min): 26 min  PT Assessment / Plan / Recommendation Comments on Treatment Session  Pt making steady progress with increased activity today and less assistance needed with mobility today.    Follow Up Recommendations  SNF     Does the patient have the potential to tolerate intense rehabilitation     Barriers to Discharge        Equipment Recommendations  Rolling walker with 5" wheels;3 in 1 bedside comode    Recommendations for Other Services    Frequency 7X/week   Plan Discharge plan remains appropriate;Frequency remains appropriate    Precautions / Restrictions Precautions Precautions: Posterior Hip Precaution Comments: 3/3 recalled  when asked to recite them.  Pt needs to be reminded of them during activity Restrictions LLE Partial Weight Bearing Percentage or Pounds: 50% PWB.   Pertinent Vitals/Pain Increase in pain in left hip with activity and fatigue    Mobility  Bed Mobility Supine to Sit: 4: Min assist;HOB flat;With rails Supine to Sit: Patient Percentage: 80% Sitting - Scoot to Edge of Bed: 4: Min guard Details for Bed Mobility Assistance: pt up in chair per nursing and family Transfers Transfers: Sit to Stand;Stand to Sit Sit to Stand: With upper extremity assist;From bed;From chair/3-in-1;With armrests;4: Min guard Stand to Sit: 4: Min guard;To chair/3-in-1;With upper extremity assist;With armrests Details for Transfer Assistance: cues for hand placement to push up with arms, to step  left foot out and keep shoulders back to avoid too much flexion at hip Ambulation/Gait Ambulation/Gait Assistance: 4: Min guard Ambulation Distance (Feet): 100 Feet Assistive device: Rolling walker Ambulation/Gait Assistance Details: min cues to push up on walker to relieve weight bearing on left leg . Pt needs more cues  with turning.  Pt needs more assist and encouragement as she fatigues Gait Pattern: Step-through pattern;Decreased stance time - left;Decreased stride length Gait velocity: decreased General Gait Details: Increase in pain with ambulation Pt also with evidence of pain and dyspnea with fatigue Stairs: No Wheelchair Mobility Wheelchair Mobility: No    Exercises Total Joint Exercises Ankle Circles/Pumps: AROM;Both;10 reps;Seated Quad Sets: AROM;Both;10 reps;Seated Gluteal Sets: AROM;Both;10 reps;Standing Hip ABduction/ADduction: AROM;Strengthening;Left;10 reps;Standing Knee Flexion: AROM;10 reps;Standing;Left Marching in Standing: AROM;Strengthening;Left;10 reps;Standing Other Exercises Other Exercises: deep breathing Other Exercises: abdominal sets Other Exercises: bilaterl UE hip to hip in sitting and standing to activate core muscle   PT Diagnosis:    PT Problem List:   PT Treatment Interventions:     PT Goals Acute Rehab PT Goals PT Goal: Sit to Stand - Progress: Progressing toward goal PT Goal: Stand to Sit - Progress: Progressing toward goal PT Goal: Ambulate - Progress: Progressing toward goal PT Goal: Perform Home Exercise Program - Progress: Progressing toward goal Additional Goals PT Goal: Additional Goal #1 - Progress: Progressing toward goal  Visit Information  Last PT Received On: 11/25/11 Assistance Needed: +1 Reason Eval/Treat Not Completed: Pain limiting ability to participate    Subjective Data  Subjective: It starting to ache a little bit Patient Stated Goal: to go to SNF for continued PT   Cognition  Overall Cognitive Status: Appears within functional limits for tasks assessed/performed Arousal/Alertness: Awake/alert Orientation Level: Appears intact for tasks assessed Behavior During Session: [facility] for tasks performed    Balance     End of Session PT - End of Session Equipment Utilized During Treatment: Gait belt Activity Tolerance: Patient tolerated  treatment well;Patient limited by pain (pt has not had pain med recently) Patient left: in chair;with call bell/phone within reach;with family/visitor present Nurse Communication: Mobility status   GP     [name], [place]  [phone] 11/25/2011, 12:14 PM

## 2011-11-25 NOTE — Progress Notes (Signed)
   Subjective: 5 Days Post-Op Procedure(s) (LRB): TOTAL HIP ARTHROPLASTY (Left)   Patient reports pain as mild, pain well controlled. She states the pain keeps getting better and better every day. No events throughout the night.   Objective:   VITALS:   Filed Vitals:   11/25/11 0527  BP: 119/63  Pulse: 58  Temp: 98.7 F (37.1 C)  Resp: 18    Neurovascular intact Dorsiflexion/Plantar flexion intact Incision: scant drainage No cellulitis present Compartment soft  LABS  Basename 11/23/11 2100 11/23/11 0450 11/22/11 1439  HGB 9.2* 7.0* 7.6*  HCT 26.2* 21.4* 22.1*  WBC 9.6 9.7 --  PLT 201 226 --    Assessment/Plan: 5 Days Post-Op Procedure(s) (LRB): TOTAL HIP ARTHROPLASTY (Left)  Up with therapy Discharge to SNF eventually, probably Monday     Anastasio Auerbach. Satoya Feeley   PAC  11/25/2011, 8:28 AM

## 2011-11-26 DIAGNOSIS — J45909 Unspecified asthma, uncomplicated: Secondary | ICD-10-CM | POA: Diagnosis not present

## 2011-11-26 DIAGNOSIS — E785 Hyperlipidemia, unspecified: Secondary | ICD-10-CM | POA: Diagnosis not present

## 2011-11-26 DIAGNOSIS — M1610 Unilateral primary osteoarthritis, unspecified hip: Secondary | ICD-10-CM | POA: Diagnosis not present

## 2011-11-26 DIAGNOSIS — Z96649 Presence of unspecified artificial hip joint: Secondary | ICD-10-CM | POA: Diagnosis not present

## 2011-11-26 DIAGNOSIS — M6281 Muscle weakness (generalized): Secondary | ICD-10-CM | POA: Diagnosis not present

## 2011-11-26 DIAGNOSIS — J449 Chronic obstructive pulmonary disease, unspecified: Secondary | ICD-10-CM | POA: Diagnosis not present

## 2011-11-26 DIAGNOSIS — M810 Age-related osteoporosis without current pathological fracture: Secondary | ICD-10-CM | POA: Diagnosis not present

## 2011-11-26 DIAGNOSIS — I10 Essential (primary) hypertension: Secondary | ICD-10-CM | POA: Diagnosis not present

## 2011-11-26 DIAGNOSIS — M129 Arthropathy, unspecified: Secondary | ICD-10-CM | POA: Diagnosis not present

## 2011-11-26 DIAGNOSIS — Z471 Aftercare following joint replacement surgery: Secondary | ICD-10-CM | POA: Diagnosis not present

## 2011-11-26 DIAGNOSIS — M1990 Unspecified osteoarthritis, unspecified site: Secondary | ICD-10-CM | POA: Diagnosis not present

## 2011-11-26 DIAGNOSIS — R262 Difficulty in walking, not elsewhere classified: Secondary | ICD-10-CM | POA: Diagnosis not present

## 2011-11-26 DIAGNOSIS — Z5189 Encounter for other specified aftercare: Secondary | ICD-10-CM | POA: Diagnosis not present

## 2011-11-26 DIAGNOSIS — D62 Acute posthemorrhagic anemia: Secondary | ICD-10-CM | POA: Diagnosis not present

## 2011-11-26 DIAGNOSIS — M25559 Pain in unspecified hip: Secondary | ICD-10-CM | POA: Diagnosis not present

## 2011-11-26 DIAGNOSIS — K2100 Gastro-esophageal reflux disease with esophagitis, without bleeding: Secondary | ICD-10-CM | POA: Diagnosis not present

## 2011-11-26 NOTE — Progress Notes (Signed)
Patient is set to discharge to [facility] today. Patient & daughter, Rodney Booze at bedside and aware. [facility] called for transport.   Clinical Social Work Department CLINICAL SOCIAL WORK PLACEMENT NOTE 11/26/2011  Patient:  VIRGILIA, QUIGG  Account Number:  [id number] Admit date:  11/20/2011  Clinical Social Worker:  Orpah Greek  Date/time:  11/21/2011 04:00 PM  Clinical Social Work is seeking post-discharge placement for this patient at the following level of care:   SKILLED NURSING   (*CSW will update this form in Epic as items are completed)   11/21/2011  Patient/family provided with [facility] Department of Clinical Social Work's list of facilities offering this level of care within the geographic area requested by the patient (or if unable, by the patient's family).  11/21/2011  Patient/family informed of their freedom to choose among providers that offer the needed level of care, that participate in Medicare, Medicaid or managed care program needed by the patient, have an available bed and are willing to accept the patient.  11/21/2011  Patient/family informed of [facility]' ownership interest in [facility], as well as of the fact that they are under no obligation to receive care at this facility.  PASARR submitted to EDS on 11/21/2011 PASARR number received from EDS on 11/21/2011  FL2 transmitted to all facilities in geographic area requested by pt/family on  11/21/2011 FL2 transmitted to all facilities within larger geographic area on   Patient informed that his/her managed care company has contracts with or will negotiate with  certain facilities, including the following:     Patient/family informed of bed offers received:  11/21/2011 Patient chooses bed at [facility] Physician recommends and patient chooses bed at    Patient to be transferred to [facility] on  11/26/2011 Patient to be transferred to facility by [facility]  The following  physician request were entered in Epic:   Additional Comments:  Unice Bailey, LCSW [facility] Clinical Social Worker cell #: [phone]

## 2011-11-26 NOTE — Progress Notes (Signed)
PT Cancellation Note  Patient Details Name: Heidi Hoffman MRN: [id number] DOB: 04/10/1932   Cancelled Treatment:    Reason Eval/Treat Not Completed: Other (comment) (d/c to SNF)  RN reports ambulance called to transport to SNF.  Checked with pt and she declined due to leaving soon.   Ziair Penson,KATHrine E 11/26/2011, 2:01 PM Pager: [phone]

## 2011-11-26 NOTE — Progress Notes (Signed)
Subjective: Doing very well this morning. Still waiting for bed at SNF.Wound looks fine.   Objective: Vital signs in last 24 hours: Temp:  [98.2 F (36.8 C)-98.3 F (36.8 C)] 98.2 F (36.8 C) (11/18 0553) Pulse Rate:  [63-77] 63  (11/18 0553) Resp:  [18-22] 18  (11/18 0553) BP: (120-147)/(52-70) 121/70 mmHg (11/18 0553) SpO2:  [97 %-99 %] 99 % (11/18 0553)  Intake/Output from previous day: 11/17 0701 - 11/18 0700 In: 1630 [P.O.:1330; I.V.:300] Out: 775 [Urine:775] Intake/Output this shift:     Basename 11/23/11 2100  HGB 9.2*    Basename 11/23/11 2100  WBC 9.6  RBC 3.09*  HCT 26.2*  PLT 201   No results found for this basename: NA:2,K:2,CL:2,CO2:2,BUN:2,CREATININE:2,GLUCOSE:2,CALCIUM:2 in the last 72 hours No results found for this basename: LABPT:2,INR:2 in the last 72 hours  No cellulitis present  Assessment/Plan: SNF Today.   Adean Milosevic A 11/26/2011, 7:10 AM

## 2011-11-26 NOTE — Progress Notes (Signed)
Patient discharge to [facility], p/u by transport personal. L hip dressing dry and intact, no complaints of any pain or discomfort upon discharge.

## 2011-11-28 DIAGNOSIS — I10 Essential (primary) hypertension: Secondary | ICD-10-CM | POA: Diagnosis not present

## 2011-11-28 DIAGNOSIS — J449 Chronic obstructive pulmonary disease, unspecified: Secondary | ICD-10-CM | POA: Diagnosis not present

## 2011-11-28 DIAGNOSIS — D62 Acute posthemorrhagic anemia: Secondary | ICD-10-CM | POA: Diagnosis not present

## 2011-11-28 DIAGNOSIS — M1610 Unilateral primary osteoarthritis, unspecified hip: Secondary | ICD-10-CM | POA: Diagnosis not present

## 2011-12-09 DIAGNOSIS — I10 Essential (primary) hypertension: Secondary | ICD-10-CM | POA: Diagnosis not present

## 2011-12-09 DIAGNOSIS — J449 Chronic obstructive pulmonary disease, unspecified: Secondary | ICD-10-CM | POA: Diagnosis not present

## 2011-12-09 DIAGNOSIS — D62 Acute posthemorrhagic anemia: Secondary | ICD-10-CM | POA: Diagnosis not present

## 2011-12-09 DIAGNOSIS — R262 Difficulty in walking, not elsewhere classified: Secondary | ICD-10-CM | POA: Diagnosis not present

## 2011-12-09 DIAGNOSIS — J45909 Unspecified asthma, uncomplicated: Secondary | ICD-10-CM | POA: Diagnosis not present

## 2011-12-09 DIAGNOSIS — Z96649 Presence of unspecified artificial hip joint: Secondary | ICD-10-CM | POA: Diagnosis not present

## 2011-12-09 DIAGNOSIS — M810 Age-related osteoporosis without current pathological fracture: Secondary | ICD-10-CM | POA: Diagnosis not present

## 2011-12-09 DIAGNOSIS — Z471 Aftercare following joint replacement surgery: Secondary | ICD-10-CM | POA: Diagnosis not present

## 2011-12-09 DIAGNOSIS — E785 Hyperlipidemia, unspecified: Secondary | ICD-10-CM | POA: Diagnosis not present

## 2011-12-09 DIAGNOSIS — M6281 Muscle weakness (generalized): Secondary | ICD-10-CM | POA: Diagnosis not present

## 2011-12-09 DIAGNOSIS — M1990 Unspecified osteoarthritis, unspecified site: Secondary | ICD-10-CM | POA: Diagnosis not present

## 2011-12-13 DIAGNOSIS — M175 Other unilateral secondary osteoarthritis of knee: Secondary | ICD-10-CM | POA: Diagnosis not present

## 2011-12-13 DIAGNOSIS — J45909 Unspecified asthma, uncomplicated: Secondary | ICD-10-CM | POA: Diagnosis not present

## 2011-12-13 DIAGNOSIS — Z96649 Presence of unspecified artificial hip joint: Secondary | ICD-10-CM | POA: Diagnosis not present

## 2011-12-13 DIAGNOSIS — I10 Essential (primary) hypertension: Secondary | ICD-10-CM | POA: Diagnosis not present

## 2011-12-13 DIAGNOSIS — Z471 Aftercare following joint replacement surgery: Secondary | ICD-10-CM | POA: Diagnosis not present

## 2011-12-17 DIAGNOSIS — Z96649 Presence of unspecified artificial hip joint: Secondary | ICD-10-CM | POA: Diagnosis not present

## 2011-12-18 DIAGNOSIS — Z96649 Presence of unspecified artificial hip joint: Secondary | ICD-10-CM | POA: Diagnosis not present

## 2011-12-18 DIAGNOSIS — M175 Other unilateral secondary osteoarthritis of knee: Secondary | ICD-10-CM | POA: Diagnosis not present

## 2011-12-18 DIAGNOSIS — I10 Essential (primary) hypertension: Secondary | ICD-10-CM | POA: Diagnosis not present

## 2011-12-18 DIAGNOSIS — Z471 Aftercare following joint replacement surgery: Secondary | ICD-10-CM | POA: Diagnosis not present

## 2011-12-18 DIAGNOSIS — J45909 Unspecified asthma, uncomplicated: Secondary | ICD-10-CM | POA: Diagnosis not present

## 2011-12-19 DIAGNOSIS — J45909 Unspecified asthma, uncomplicated: Secondary | ICD-10-CM | POA: Diagnosis not present

## 2011-12-19 DIAGNOSIS — Z96649 Presence of unspecified artificial hip joint: Secondary | ICD-10-CM | POA: Diagnosis not present

## 2011-12-19 DIAGNOSIS — M175 Other unilateral secondary osteoarthritis of knee: Secondary | ICD-10-CM | POA: Diagnosis not present

## 2011-12-19 DIAGNOSIS — I10 Essential (primary) hypertension: Secondary | ICD-10-CM | POA: Diagnosis not present

## 2011-12-19 DIAGNOSIS — Z471 Aftercare following joint replacement surgery: Secondary | ICD-10-CM | POA: Diagnosis not present

## 2011-12-20 DIAGNOSIS — Z471 Aftercare following joint replacement surgery: Secondary | ICD-10-CM | POA: Diagnosis not present

## 2011-12-20 DIAGNOSIS — J45909 Unspecified asthma, uncomplicated: Secondary | ICD-10-CM | POA: Diagnosis not present

## 2011-12-20 DIAGNOSIS — M175 Other unilateral secondary osteoarthritis of knee: Secondary | ICD-10-CM | POA: Diagnosis not present

## 2011-12-20 DIAGNOSIS — I10 Essential (primary) hypertension: Secondary | ICD-10-CM | POA: Diagnosis not present

## 2011-12-20 DIAGNOSIS — Z96649 Presence of unspecified artificial hip joint: Secondary | ICD-10-CM | POA: Diagnosis not present

## 2011-12-21 DIAGNOSIS — Z471 Aftercare following joint replacement surgery: Secondary | ICD-10-CM | POA: Diagnosis not present

## 2011-12-21 DIAGNOSIS — J45909 Unspecified asthma, uncomplicated: Secondary | ICD-10-CM | POA: Diagnosis not present

## 2011-12-21 DIAGNOSIS — I10 Essential (primary) hypertension: Secondary | ICD-10-CM | POA: Diagnosis not present

## 2011-12-21 DIAGNOSIS — Z96649 Presence of unspecified artificial hip joint: Secondary | ICD-10-CM | POA: Diagnosis not present

## 2011-12-21 DIAGNOSIS — M175 Other unilateral secondary osteoarthritis of knee: Secondary | ICD-10-CM | POA: Diagnosis not present

## 2011-12-25 DIAGNOSIS — J45909 Unspecified asthma, uncomplicated: Secondary | ICD-10-CM | POA: Diagnosis not present

## 2011-12-25 DIAGNOSIS — M175 Other unilateral secondary osteoarthritis of knee: Secondary | ICD-10-CM | POA: Diagnosis not present

## 2011-12-25 DIAGNOSIS — I10 Essential (primary) hypertension: Secondary | ICD-10-CM | POA: Diagnosis not present

## 2011-12-25 DIAGNOSIS — Z96649 Presence of unspecified artificial hip joint: Secondary | ICD-10-CM | POA: Diagnosis not present

## 2011-12-25 DIAGNOSIS — Z471 Aftercare following joint replacement surgery: Secondary | ICD-10-CM | POA: Diagnosis not present

## 2011-12-28 DIAGNOSIS — I10 Essential (primary) hypertension: Secondary | ICD-10-CM | POA: Diagnosis not present

## 2011-12-28 DIAGNOSIS — J45909 Unspecified asthma, uncomplicated: Secondary | ICD-10-CM | POA: Diagnosis not present

## 2011-12-28 DIAGNOSIS — M175 Other unilateral secondary osteoarthritis of knee: Secondary | ICD-10-CM | POA: Diagnosis not present

## 2011-12-28 DIAGNOSIS — Z471 Aftercare following joint replacement surgery: Secondary | ICD-10-CM | POA: Diagnosis not present

## 2011-12-28 DIAGNOSIS — Z96649 Presence of unspecified artificial hip joint: Secondary | ICD-10-CM | POA: Diagnosis not present

## 2011-12-31 DIAGNOSIS — J45909 Unspecified asthma, uncomplicated: Secondary | ICD-10-CM | POA: Diagnosis not present

## 2011-12-31 DIAGNOSIS — I10 Essential (primary) hypertension: Secondary | ICD-10-CM | POA: Diagnosis not present

## 2011-12-31 DIAGNOSIS — Z471 Aftercare following joint replacement surgery: Secondary | ICD-10-CM | POA: Diagnosis not present

## 2011-12-31 DIAGNOSIS — Z96649 Presence of unspecified artificial hip joint: Secondary | ICD-10-CM | POA: Diagnosis not present

## 2011-12-31 DIAGNOSIS — M175 Other unilateral secondary osteoarthritis of knee: Secondary | ICD-10-CM | POA: Diagnosis not present

## 2012-01-04 DIAGNOSIS — I10 Essential (primary) hypertension: Secondary | ICD-10-CM | POA: Diagnosis not present

## 2012-01-04 DIAGNOSIS — M175 Other unilateral secondary osteoarthritis of knee: Secondary | ICD-10-CM | POA: Diagnosis not present

## 2012-01-04 DIAGNOSIS — Z96649 Presence of unspecified artificial hip joint: Secondary | ICD-10-CM | POA: Diagnosis not present

## 2012-01-04 DIAGNOSIS — J45909 Unspecified asthma, uncomplicated: Secondary | ICD-10-CM | POA: Diagnosis not present

## 2012-01-04 DIAGNOSIS — Z471 Aftercare following joint replacement surgery: Secondary | ICD-10-CM | POA: Diagnosis not present

## 2012-01-28 DIAGNOSIS — Z96649 Presence of unspecified artificial hip joint: Secondary | ICD-10-CM | POA: Diagnosis not present

## 2012-03-25 ENCOUNTER — Telehealth: Payer: Self-pay | Admitting: Internal Medicine

## 2012-03-25 NOTE — Telephone Encounter (Signed)
Pt called and stated that she needed a refill on her Losartan 145mg  1 po BID. She utilizes [facility] in [place] on [address] as her pharmacy. Please assist.

## 2012-03-26 MED ORDER — LOSARTAN POTASSIUM-HCTZ 100-25 MG PO TABS: 1.0000 | ORAL_TABLET | Freq: Every morning | ORAL | Status: DC

## 2012-03-26 NOTE — Telephone Encounter (Signed)
rx sent in electronically 

## 2012-04-12 ENCOUNTER — Encounter: Payer: Self-pay | Admitting: *Deleted

## 2012-04-12 ENCOUNTER — Emergency Department (INDEPENDENT_AMBULATORY_CARE_PROVIDER_SITE_OTHER)
Admission: EM | Admit: 2012-04-12 | Discharge: 2012-04-12 | Disposition: A | Discharge status: Home / Self Care | Payer: Medicare Other | Source: Home / Self Care | Attending: Family Medicine | Admitting: Family Medicine

## 2012-04-12 DIAGNOSIS — J45901 Unspecified asthma with (acute) exacerbation: Secondary | ICD-10-CM | POA: Diagnosis not present

## 2012-04-12 DIAGNOSIS — J069 Acute upper respiratory infection, unspecified: Secondary | ICD-10-CM | POA: Diagnosis not present

## 2012-04-12 MED ORDER — DOXYCYCLINE HYCLATE 100 MG PO CAPS: 100.0000 mg | ORAL_CAPSULE | Freq: Two times a day (BID) | ORAL | Status: AC

## 2012-04-12 MED ORDER — METHYLPREDNISOLONE SODIUM SUCC 125 MG IJ SOLR
125.0000 mg | Freq: Once | INTRAMUSCULAR | Status: AC
  Administered 2012-04-12: 125 mg via INTRAMUSCULAR

## 2012-04-12 MED ORDER — PREDNISONE 50 MG PO TABS: ORAL_TABLET | ORAL | Status: DC

## 2012-04-12 NOTE — ED Notes (Signed)
Pt c/o cough, wheezing, SOB x 1 month. Has tried OTC Robutissin, Vit C, Tea w/ lemon with no relief. States she has been doing Neb treatments and inhaler at home with no relief.

## 2012-04-12 NOTE — ED Provider Notes (Addendum)
History     CSN: [id number]  Arrival date & time 04/12/12  1548   First MD Initiated Contact with Patient 04/12/12 1550      Chief Complaint  Patient presents with  . Cough  . Wheezing    HPI Cough and wheezing x3 weeks. Patient states that she's had persistent cough is despite albuterol use over the past 3 weeks. No fevers or chills. Patient had mild sinus change. No itchy eyes scratchy throat. Patient denies any prior history of allergic symptoms related to season change. No shortness of breath. Patient states she has had difficulty with coughing with eating. Patient tried over-the-counter medications like Robitussin vitamin C with no relief in symptoms. No chest pains.    Past Medical History  Diagnosis Date  . Asthma   . GERD (gastroesophageal reflux disease)   . Hyperlipidemia   . Hypertension   . Osteoporosis   . COPD (chronic obstructive pulmonary disease)   . Allergy   . Anemia   . Diverticulosis   . Tubular adenoma of colon 10/01    Past Surgical History  Procedure Laterality Date  . Cardiac catheterization  2001  . Total hip arthroplasty  11/20/2011    Procedure: TOTAL HIP ARTHROPLASTY;  Surgeon: Jacki Cones, MD;  Location: [facility];  Service: Orthopedics;  Laterality: Left;    Family History  Problem Relation Age of Onset  . Heart disease Mother   . Diabetes Sister   . Asthma Daughter   . Asthma Son     History  Substance Use Topics  . Smoking status: Former Smoker    Quit date: 05/09/2000  . Smokeless tobacco: Not on file  . Alcohol Use: 0.6 oz/week    1 Glasses of wine per week     Comment: occ.    OB History   Grav Para Term Preterm Abortions TAB SAB Ect Mult Living                  Review of Systems  All other systems reviewed and are negative.    Allergies  Review of patient's allergies indicates no known allergies.  Home Medications   Current Outpatient Rx  Name  Route  Sig  Dispense  Refill  . bisacodyl (DULCOLAX) 10 MG  suppository   Rectal   Place 1 suppository (10 mg total) rectally daily as needed.   8 suppository   0   . ferrous sulfate 325 (65 FE) MG tablet   Oral   Take 1 tablet (325 mg total) by mouth 3 (three) times daily after meals.   30 tablet   0   . fluticasone (FLOVENT HFA) 44 MCG/ACT inhaler   Inhalation   Inhale 2 puffs into the lungs 2 (two) times daily.   1 Inhaler   12   . losartan-hydrochlorothiazide (HYZAAR) 100-25 MG per tablet   Oral   Take 1 tablet by mouth every morning.   90 tablet   1   . methocarbamol (ROBAXIN) 500 MG tablet   Oral   Take 1 tablet (500 mg total) by mouth every 6 (six) hours as needed.   40 tablet   1   . oxyCODONE-acetaminophen (PERCOCET/ROXICET) 5-325 MG per tablet   Oral   Take 2 tablets by mouth every 4 (four) hours as needed (Q4-6 hours PRN).   60 tablet   0   . rivaroxaban (XARELTO) 10 MG TABS tablet   Oral   Take 1 tablet (10 mg total) by mouth  daily with breakfast.   18 tablet   0     BP 167/72  Pulse 75  Temp(Src) 97.9 F (36.6 C) (Oral)  Resp 20  Ht 5\' 6"  (1.676 m)  Wt 192 lb (87.091 kg)  BMI 31 kg/m2  SpO2 97%  Physical Exam  Constitutional: She appears well-developed and well-nourished.  HENT:  Head: Normocephalic and atraumatic.  Right Ear: External ear normal.  Left Ear: External ear normal.  Eyes: Conjunctivae are normal. Pupils are equal, round, and reactive to light.  Neck: Normal range of motion.  Cardiovascular: Normal rate and regular rhythm.   Pulmonary/Chest: Effort normal. She has wheezes.  Abdominal: Soft.  Musculoskeletal: Normal range of motion.  Neurological: She is alert.  Skin: Skin is warm.    ED Course  Procedures (including critical care time)  Labs Reviewed - No data to display No results found.   1. URI (upper respiratory infection)   2. Asthma exacerbation       MDM  Solumedrol 125mg  IM x1  Prednisone x 5 days.  Doxy for resp coverage.  Discussed infectious and resp  sxs at length with pt.  Follow up with PCP early next week.     The patient and/or caregiver has been counseled thoroughly with regard to treatment plan and/or medications prescribed including dosage, schedule, interactions, rationale for use, and possible side effects and they verbalize understanding. Diagnoses and expected course of recovery discussed and will return if not improved as expected or if the condition worsens. Patient and/or caregiver verbalized understanding.             Doree Albee, MD 04/12/12 1624  Doree Albee, MD 04/12/12 [phone]

## 2012-04-21 DIAGNOSIS — Z96649 Presence of unspecified artificial hip joint: Secondary | ICD-10-CM | POA: Diagnosis not present

## 2012-05-15 ENCOUNTER — Telehealth: Payer: Self-pay | Admitting: Internal Medicine

## 2012-05-15 NOTE — Telephone Encounter (Signed)
Ok per Dr Swords 

## 2012-05-15 NOTE — Telephone Encounter (Signed)
Pt needs to appt May 20-23 for a cpe. Pt is going out of town for the summer.  No appt for Dr Cato Mulligan. Pt request another provider. Is it ok if she sees Dr Selena Batten?

## 2012-05-28 ENCOUNTER — Encounter: Payer: Self-pay | Admitting: Family Medicine

## 2012-05-28 ENCOUNTER — Ambulatory Visit (INDEPENDENT_AMBULATORY_CARE_PROVIDER_SITE_OTHER): Payer: Medicare Other | Admitting: Family Medicine

## 2012-05-28 VITALS — BP 114/80 | Temp 98.0°F | Ht 64.0 in | Wt 194.0 lb

## 2012-05-28 DIAGNOSIS — E559 Vitamin D deficiency, unspecified: Secondary | ICD-10-CM

## 2012-05-28 DIAGNOSIS — E785 Hyperlipidemia, unspecified: Secondary | ICD-10-CM

## 2012-05-28 DIAGNOSIS — D649 Anemia, unspecified: Secondary | ICD-10-CM | POA: Diagnosis not present

## 2012-05-28 DIAGNOSIS — Z0000 Encounter for general adult medical examination without abnormal findings: Secondary | ICD-10-CM

## 2012-05-28 DIAGNOSIS — I10 Essential (primary) hypertension: Secondary | ICD-10-CM

## 2012-05-28 DIAGNOSIS — M8580 Other specified disorders of bone density and structure, unspecified site: Secondary | ICD-10-CM

## 2012-05-28 DIAGNOSIS — M949 Disorder of cartilage, unspecified: Secondary | ICD-10-CM

## 2012-05-28 LAB — LIPID PANEL
Cholesterol: 246 mg/dL — ABNORMAL HIGH (ref 0–200)
Total CHOL/HDL Ratio: 5
Triglycerides: 128 mg/dL (ref 0.0–149.0)

## 2012-05-28 LAB — CBC WITH DIFFERENTIAL/PLATELET
Basophils Absolute: 0 10*3/uL (ref 0.0–0.1)
Basophils Relative: 0.4 % (ref 0.0–3.0)
Eosinophils Absolute: 0.3 10*3/uL (ref 0.0–0.7)
Lymphocytes Relative: 33.1 % (ref 12.0–46.0)
MCHC: 32.6 g/dL (ref 30.0–36.0)
MCV: 89.3 fl (ref 78.0–100.0)
Monocytes Absolute: 0.5 10*3/uL (ref 0.1–1.0)
Neutrophils Relative %: 55.7 % (ref 43.0–77.0)
RBC: 3.88 Mil/uL (ref 3.87–5.11)
RDW: 14.8 % — ABNORMAL HIGH (ref 11.5–14.6)

## 2012-05-28 LAB — BASIC METABOLIC PANEL
BUN: 14 mg/dL (ref 6–23)
CO2: 27 mEq/L (ref 19–32)
Calcium: 11 mg/dL — ABNORMAL HIGH (ref 8.4–10.5)
Chloride: 103 mEq/L (ref 96–112)
Creatinine, Ser: 1 mg/dL (ref 0.4–1.2)
Glucose, Bld: 79 mg/dL (ref 70–99)

## 2012-05-28 NOTE — Patient Instructions (Addendum)
-  take 1000IU vitamin D daily and calcium 1200mg  daily  -mammogram next month  -colonoscopy next year  -bone dexa next year  We recommend the following healthy lifestyle measures: - eat a healthy diet consisting of lots of vegetables, fruits, beans, nuts, seeds, healthy meats such as white chicken and fish and whole grains.  - avoid fried foods, fast food, processed foods, sodas, red meet and other fattening foods.  - get a least 150 minutes of aerobic exercise per week.   -We have ordered labs or studies at this visit. It can take up to 1-2 weeks for results and processing. We will contact you with instructions IF your results are abnormal. Normal results will be released to your [facility]. If you have not heard from [place] or can not find your results in [facility] in 2 weeks please contact our office.   Follow up with your doctor for chronic disease management and lab review in 2-3 months

## 2012-05-28 NOTE — Progress Notes (Signed)
No chief complaint on file.   HPI:  Here for CPE: 79 yo F patient of Dr. Cato Mulligan with known anemia, asthma, gerd, mild renal insufficiency, HLD, Osteoporosis, COPD, diverticulosis here for physical. She has not had basic labs in some time. She showed up very late for her appointment and we only had a few minutes for her appointment - she mainly wanted basic labs.She has no complaints today and states she feels quite well after recomvering from knee replacement.  -Diet: trying to eat a healthy diet  -Exercise: she is walking daily  -Vitamin D: takes multivitamin and caltrate  -Diabetes and Dyslipidemia Screening: lipids not checked since 2012 - she FASTING today  -Hx of HTN: h/o treated htn  -Vaccines: UTD  -falls/depression: see chart - no concerns  -pap history: N/A aged out  -sexual activity: yes, female partner, no new partners  -wants STI testing: no  -FH breast, colon or ovarian ca: see FH -breast ca screening: last mammo last year per her report - mammogram is scheduled for next month per her report -colon cancer screening: reports last colonscopy was in 2010 and thinks needed one in five years - reports she keeps up on this, is sent reminders  Bone health: Last bone density in chart 06/2011 - result notes comment on osteopenia and recs for vit d and calcium which she is doing and repeat dexa in 2 years  -Alcohol, Tobacco, drug use: see social history  Review of Systems - denies: fevers, unexplained weight loss, syncope, CP, SOB, dizziness, bleeding, skin rash, bowel changes, dysuria, depression  Past Medical History  Diagnosis Date  . Asthma   . GERD (gastroesophageal reflux disease)   . Hyperlipidemia   . Hypertension   . Osteoporosis   . COPD (chronic obstructive pulmonary disease)   . Allergy   . Anemia   . Diverticulosis   . Tubular adenoma of colon 10/01    Family History  Problem Relation Age of Onset  . Heart disease Mother   . Diabetes Sister   .  Asthma Daughter   . Asthma Son     History   Social History  . Marital Status: Single    Spouse Name: N/A    Number of Children: N/A  . Years of Education: N/A   Social History Main Topics  . Smoking status: Former Smoker    Quit date: 05/09/2000  . Smokeless tobacco: None  . Alcohol Use: 0.6 oz/week    1 Glasses of wine per week     Comment: occ.  . Drug Use: No  . Sexually Active: Not Currently   Other Topics Concern  . None   Social History Narrative  . None    Current outpatient prescriptions:aspirin 81 MG tablet, Take 81 mg by mouth daily., Disp: , Rfl: ;  Calcium-Vitamin D (CALTRATE 600 PLUS-VIT D PO), Take by mouth daily., Disp: , Rfl: ;  ferrous sulfate 325 (65 FE) MG tablet, Take 1 tablet (325 mg total) by mouth 3 (three) times daily after meals., Disp: 30 tablet, Rfl: 0;  fluticasone (FLOVENT HFA) 44 MCG/ACT inhaler, Inhale 2 puffs into the lungs 2 (two) times daily., Disp: 1 Inhaler, Rfl: 12 losartan-hydrochlorothiazide (HYZAAR) 100-25 MG per tablet, Take 1 tablet by mouth every morning., Disp: 90 tablet, Rfl: 1;  Multiple Vitamins-Minerals (CENTRUM SILVER ULTRA WOMENS PO), Take by mouth., Disp: , Rfl: ;  bisacodyl (DULCOLAX) 10 MG suppository, Place 1 suppository (10 mg total) rectally daily as needed., Disp: 8 suppository, Rfl:  0 methocarbamol (ROBAXIN) 500 MG tablet, Take 1 tablet (500 mg total) by mouth every 6 (six) hours as needed., Disp: 40 tablet, Rfl: 1;  oxyCODONE-acetaminophen (PERCOCET/ROXICET) 5-325 MG per tablet, Take 2 tablets by mouth every 4 (four) hours as needed (Q4-6 hours PRN)., Disp: 60 tablet, Rfl: 0;  predniSONE (DELTASONE) 50 MG tablet, 1 tab daily x 5 days, Disp: 5 tablet, Rfl: 0 rivaroxaban (XARELTO) 10 MG TABS tablet, Take 1 tablet (10 mg total) by mouth daily with breakfast., Disp: 18 tablet, Rfl: 0  EXAM:  Filed Vitals:   05/28/12 0949  BP: 114/80  Temp: 98 F (36.7 C)    GENERAL: vitals reviewed and listed below, alert, oriented,  appears well hydrated and in no acute distress  HEENT: head atraumatic, PERRLA, normal appearance of eyes, ears, nose and mouth. moist mucus membranes.  NECK: supple, no masses or lymphadenopathy  LUNGS: clear to auscultation bilaterally, no rales, rhonchi or wheeze  CV: HRRR, no peripheral edema or cyanosis, normal pedal pulses  BREAST: refused  ABDOMEN: bowel sounds normal, soft, non tender to palpation, no masses, no rebound or guarding  GU: refused  RECTAL: refused  SKIN: no rash or abnormal lesions  MS: normal gait, moves all extremities normally  NEURO: CN II-XII grossly intact, normal muscle strength and sensation to light touch on extremities  PSYCH: normal affect, pleasant and cooperative - well aware of medications and health history  ASSESSMENT AND PLAN:  Discussed the following assessment and plan:  Visit for preventive health examination  HYPERLIPIDEMIA - Plan: Lipid Panel  ANEMIA - Plan: CBC with Differential  HYPERTENSION - Plan: Basic metabolic panel  Vitamin D deficiency  Osteopenia  -79 yo F hear for physical  -Discussed and advised all [place] preventive services health task force level A and B recommendations for age, sex and risks.  -Advised at least 150 minutes of exercise per week and a healthy diet low in saturated fats and sweets and consisting of fresh fruits and vegetables, lean meats such as fish and white chicken and whole grains.  -labs, studies and vaccines per orders this encounter  -advised to follow up with PCP for chronic disease management.  Orders Placed This Encounter  Procedures  . Lipid Panel  . Basic metabolic panel  . CBC with Differential    Patient Instructions:  -take 1000IU vitamin D daily and calcium 1200mg  daily  -mammogram next month  -colonoscopy next year  -bone dexa next year  We recommend the following healthy lifestyle measures: - eat a healthy diet consisting of lots of vegetables, fruits, beans,  nuts, seeds, healthy meats such as white chicken and fish and whole grains.  - avoid fried foods, fast food, processed foods, sodas, red meet and other fattening foods.  - get a least 150 minutes of aerobic exercise per week.   -We have ordered labs or studies at this visit. It can take up to 1-2 weeks for results and processing. We will contact you with instructions IF your results are abnormal. Normal results will be released to your [facility]. If you have not heard from [place] or can not find your results in [facility] in 2 weeks please contact our office.   Follow up with your doctor for chronic disease management and lab review in 2-3 months  Patient advised to return to clinic immediately if symptoms worsen or persist or new concerns.   No Follow-up on file.  Kriste Basque R.

## 2012-05-29 NOTE — Progress Notes (Signed)
Quick Note:  Called and spoke with pt and pt is aware of results. ______ 

## 2012-06-23 ENCOUNTER — Encounter: Payer: PRIVATE HEALTH INSURANCE | Admitting: Internal Medicine

## 2012-06-30 DIAGNOSIS — Z1231 Encounter for screening mammogram for malignant neoplasm of breast: Secondary | ICD-10-CM | POA: Diagnosis not present

## 2012-08-15 ENCOUNTER — Encounter: Payer: Self-pay | Admitting: Internal Medicine

## 2012-09-03 DIAGNOSIS — J309 Allergic rhinitis, unspecified: Secondary | ICD-10-CM | POA: Diagnosis not present

## 2012-09-03 DIAGNOSIS — J45909 Unspecified asthma, uncomplicated: Secondary | ICD-10-CM | POA: Diagnosis not present

## 2012-09-15 ENCOUNTER — Encounter: Payer: PRIVATE HEALTH INSURANCE | Admitting: Internal Medicine

## 2012-09-23 ENCOUNTER — Other Ambulatory Visit: Payer: Self-pay | Admitting: Internal Medicine

## 2012-12-26 ENCOUNTER — Other Ambulatory Visit: Payer: Self-pay | Admitting: Internal Medicine

## 2013-01-19 ENCOUNTER — Ambulatory Visit (INDEPENDENT_AMBULATORY_CARE_PROVIDER_SITE_OTHER): Payer: Medicare Other | Admitting: Internal Medicine

## 2013-01-19 DIAGNOSIS — Z23 Encounter for immunization: Secondary | ICD-10-CM

## 2013-01-30 ENCOUNTER — Encounter: Payer: Self-pay | Admitting: Gastroenterology

## 2013-02-03 IMAGING — US US ABDOMEN COMPLETE
1 series · 13 of 25 positions shown · non-contrast
Comparison: None;

CLINICAL DATA: Subcapsular lesions left lobe liver on outside CT

ULTRASOUND ABDOMEN:
TECHNIQUE: Sonography of upper abdominal structures was performed.

[Series 1: us abdomen complete · 0.22mm/px · 13 of 80 slices shown]
[im 1/80]
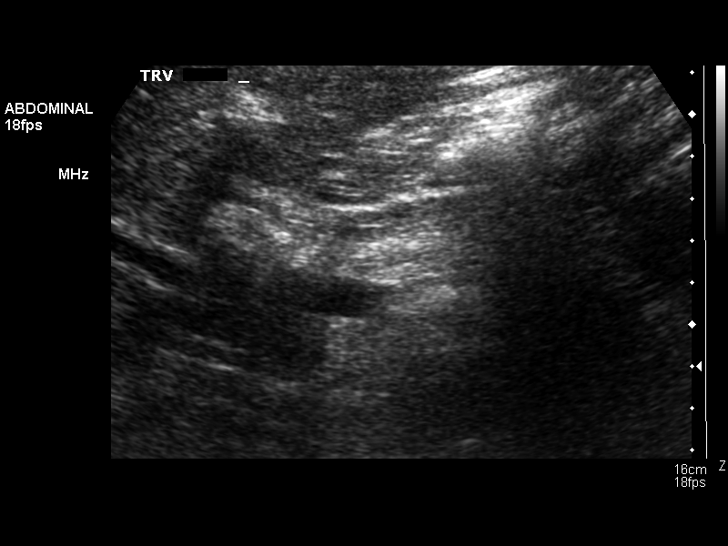
[im 7/80]
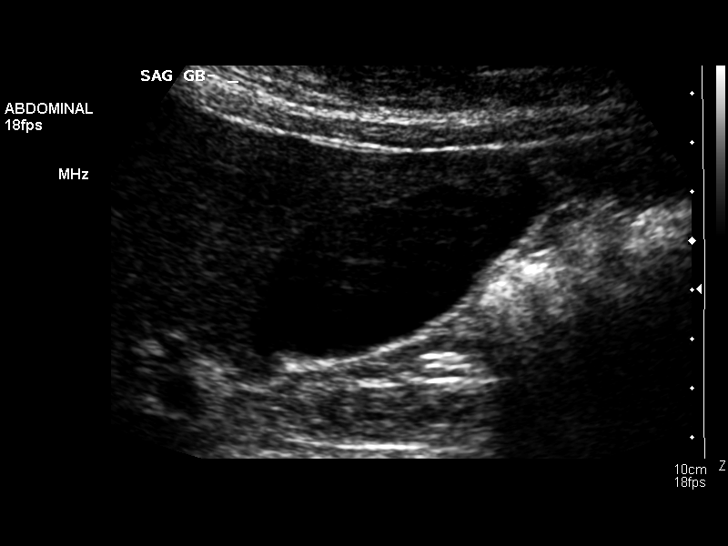
[im 14/80]
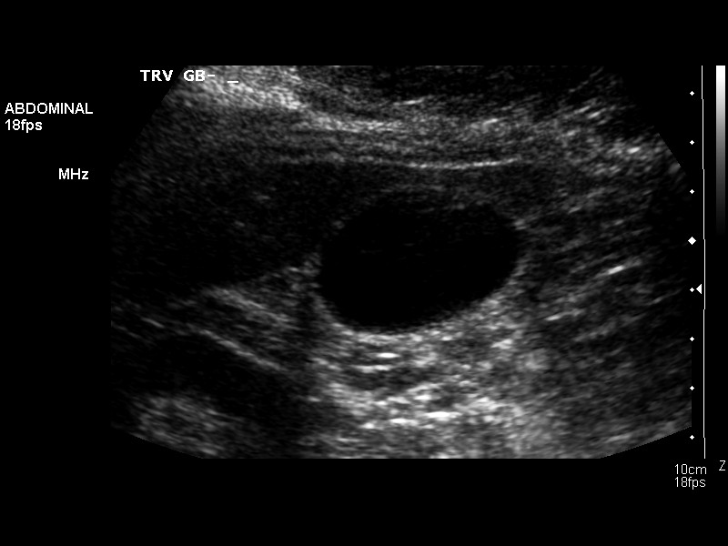
[im 20/80]
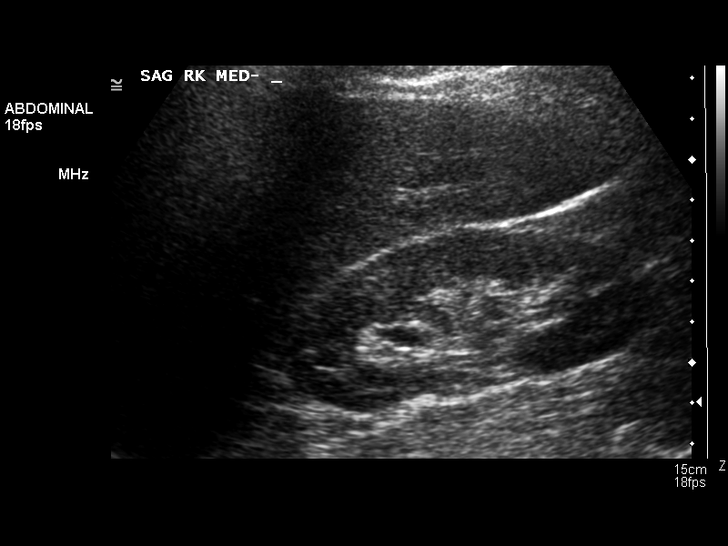
[im 27/80]
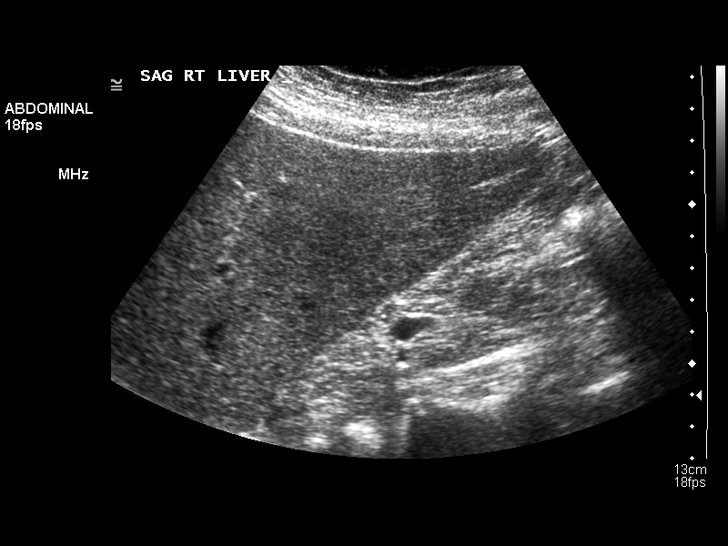
[im 33/80]
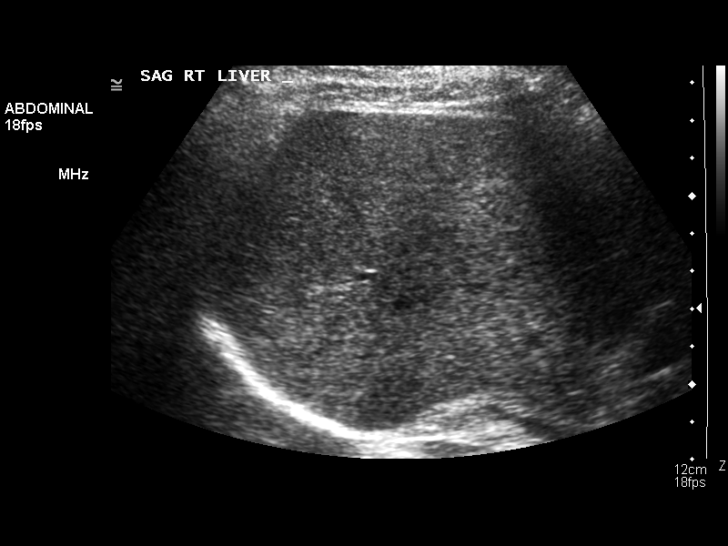
[im 40/80]
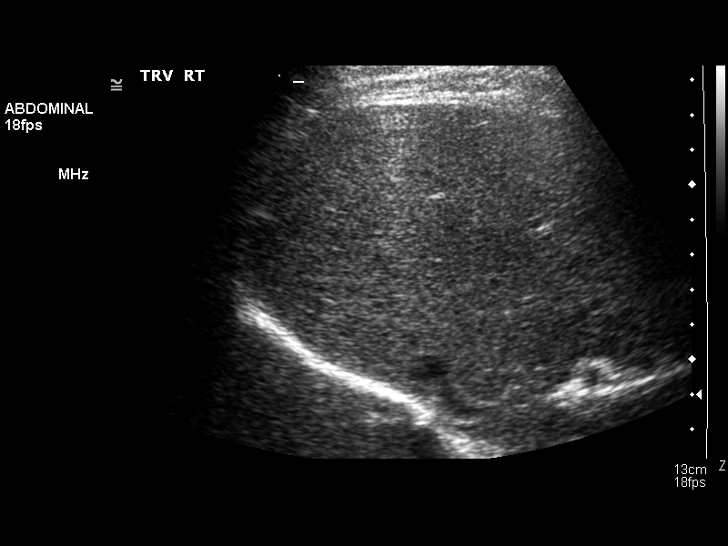
[im 47/80]
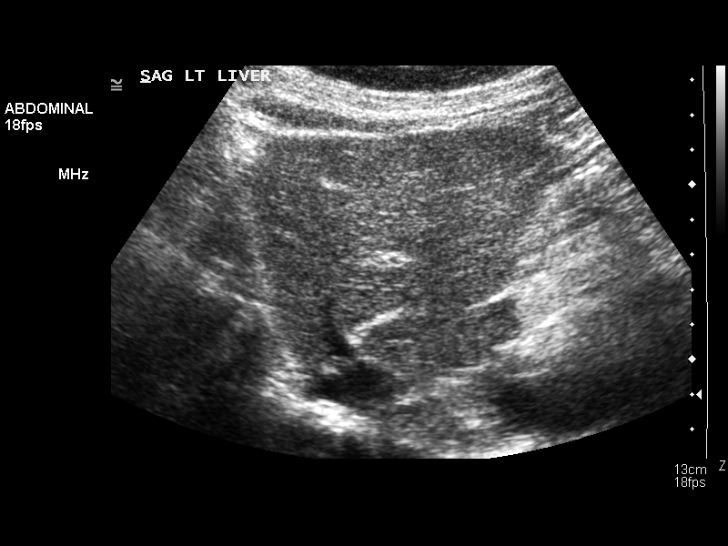
[im 53/80]
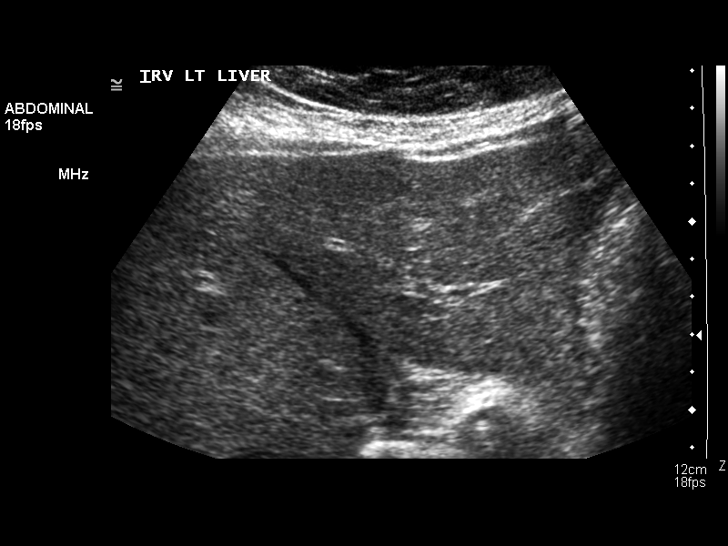
[im 60/80]
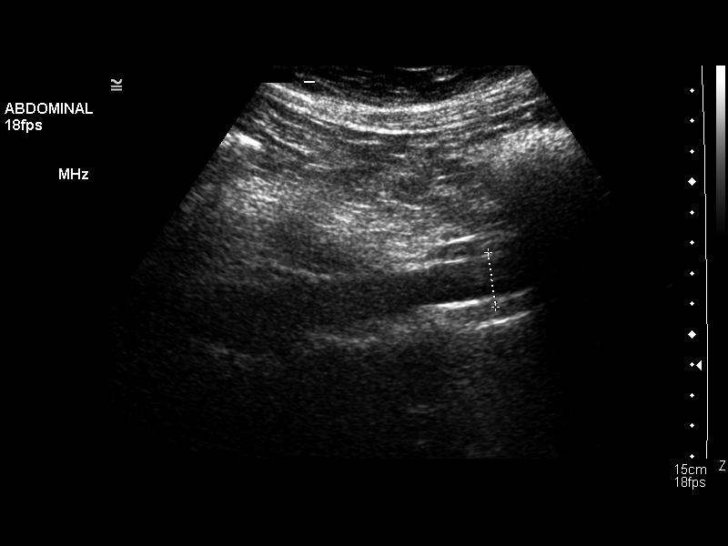
[im 66/80]
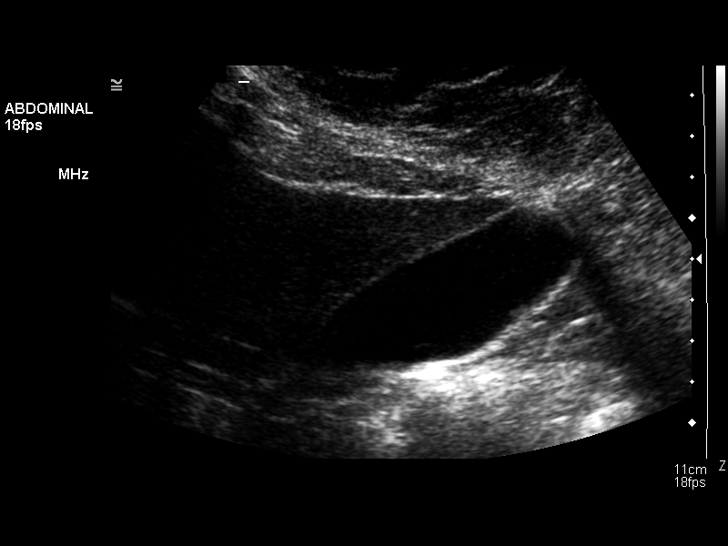
[im 73/80]
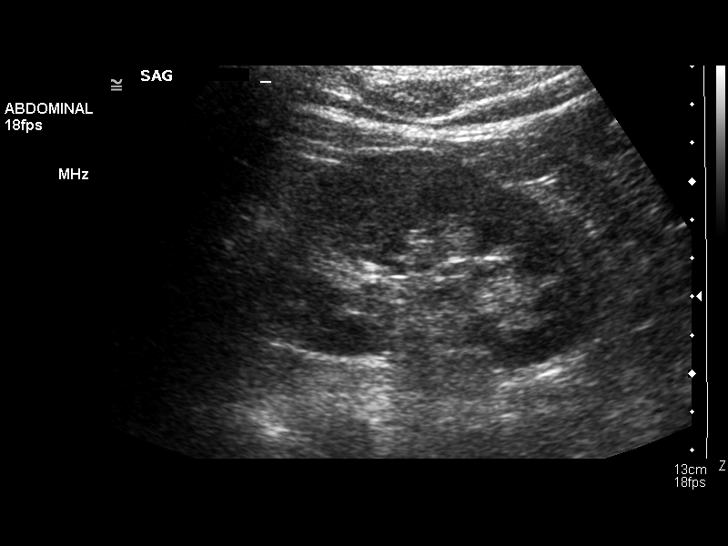
[im 80/80]
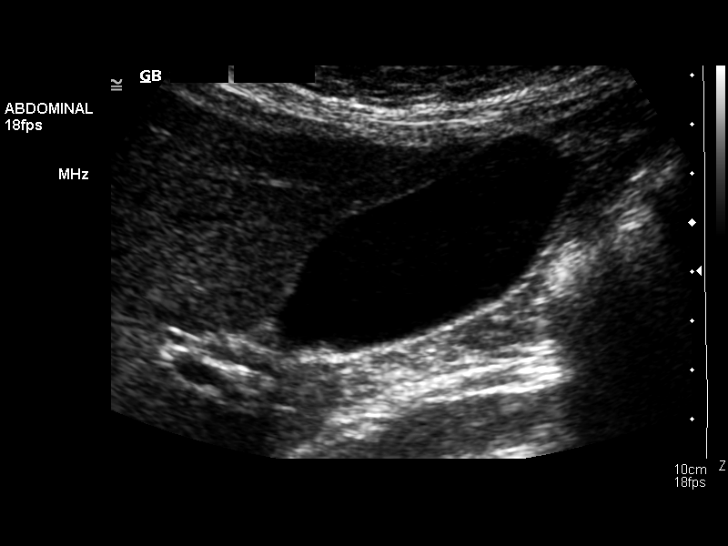

[13 of 25 positions shown; findings below may reference images not displayed]

specifically, the prior abnormal outside CT exam
reportedly from [facility] [[email or web address]] on 05/03/2011 is not
available for comparison.

Gallbladder:  Normally distended without stones or wall thickening.
No pericholecystic fluid or sonographic Murphy sign.

Common bile duct:  4 mm diameter, normal

Liver:  Grossly normal echogenicity.  Hepatopetal portal venous
flow.  No definite focal hepatic mass or nodularity.  No
intrahepatic biliary dilatation.

IVC:  Normal appearance

Pancreas:  Inadequate visualization secondary to obscuration by
bowel gas

Spleen:  Normal appearance, 2.4 cm length

Right kidney:  9.5 cm length. Normal morphology without mass or
hydronephrosis.

Left kidney:  10.3 cm length. Normal morphology without mass or
hydronephrosis.

Aorta:  Normal caliber

Other:  No free fluid
IMPRESSION: No upper abdominal sonographic abnormalities identified.
Specifically, no subcapsular abnormalities of the liver are seen.
Significance of the previous CT finding is uncertain.
Either follow-up CT evaluation to establish stability or further
characterization of liver by MR imaging with/without contrast
recommended.

## 2013-02-18 ENCOUNTER — Encounter: Payer: Self-pay | Admitting: Gastroenterology

## 2013-03-02 ENCOUNTER — Other Ambulatory Visit: Payer: Self-pay | Admitting: Internal Medicine

## 2013-04-14 ENCOUNTER — Ambulatory Visit (AMBULATORY_SURGERY_CENTER): Payer: Self-pay | Admitting: *Deleted

## 2013-04-14 VITALS — Ht 64.0 in | Wt 204.0 lb

## 2013-04-14 DIAGNOSIS — Z86010 Personal history of colonic polyps: Secondary | ICD-10-CM

## 2013-04-14 MED ORDER — SOD PICOSULFATE-MAG OX-CIT ACD 10-3.5-12 MG-GM-GM PO PACK: 1.0000 | PACK | Freq: Once | ORAL | Status: DC

## 2013-04-14 NOTE — Progress Notes (Signed)
Denies allergies to eggs or soy products. Denies complications with sedation or anesthesia. 

## 2013-04-28 ENCOUNTER — Ambulatory Visit (AMBULATORY_SURGERY_CENTER): Payer: Medicare Other | Admitting: Gastroenterology

## 2013-04-28 ENCOUNTER — Encounter: Payer: Self-pay | Admitting: Gastroenterology

## 2013-04-28 VITALS — BP 138/73 | HR 56 | Temp 97.6°F | Resp 18 | Ht 64.0 in | Wt 204.0 lb

## 2013-04-28 DIAGNOSIS — D126 Benign neoplasm of colon, unspecified: Secondary | ICD-10-CM | POA: Diagnosis not present

## 2013-04-28 DIAGNOSIS — Z86010 Personal history of colonic polyps: Secondary | ICD-10-CM | POA: Diagnosis not present

## 2013-04-28 DIAGNOSIS — Z1211 Encounter for screening for malignant neoplasm of colon: Secondary | ICD-10-CM | POA: Diagnosis not present

## 2013-04-28 DIAGNOSIS — J449 Chronic obstructive pulmonary disease, unspecified: Secondary | ICD-10-CM | POA: Diagnosis not present

## 2013-04-28 DIAGNOSIS — I10 Essential (primary) hypertension: Secondary | ICD-10-CM | POA: Diagnosis not present

## 2013-04-28 MED ORDER — SODIUM CHLORIDE 0.9 % IV SOLN: 500.0000 mL | INTRAVENOUS | Status: DC

## 2013-04-28 NOTE — Patient Instructions (Signed)
YOU HAD AN ENDOSCOPIC PROCEDURE TODAY AT THE [facility]: Refer to the procedure report that was given to you for any specific questions about what was found during the examination.  If the procedure report does not answer your questions, please call your gastroenterologist to clarify.  If you requested that your care partner not be given the details of your procedure findings, then the procedure report has been included in a sealed envelope for you to review at your convenience later.  YOU SHOULD EXPECT: Some feelings of bloating in the abdomen. Passage of more gas than usual.  Walking can help get rid of the air that was put into your GI tract during the procedure and reduce the bloating. If you had a lower endoscopy (such as a colonoscopy or flexible sigmoidoscopy) you may notice spotting of blood in your stool or on the toilet paper. If you underwent a bowel prep for your procedure, then you may not have a normal bowel movement for a few days.  DIET: Your first meal following the procedure should be a light meal and then it is ok to progress to your normal diet.  A half-sandwich or bowl of soup is an example of a good first meal.  Heavy or fried foods are harder to digest and may make you feel nauseous or bloated.  Likewise meals heavy in dairy and vegetables can cause extra gas to form and this can also increase the bloating.  Drink plenty of fluids but you should avoid alcoholic beverages for 24 hours.  ACTIVITY: Your care partner should take you home directly after the procedure.  You should plan to take it easy, moving slowly for the rest of the day.  You can resume normal activity the day after the procedure however you should NOT DRIVE or use heavy machinery for 24 hours (because of the sedation medicines used during the test).    SYMPTOMS TO REPORT IMMEDIATELY: A gastroenterologist can be reached at any hour.  During normal business hours, 8:30 AM to 5:00 PM Monday through Friday,  call [phone].  After hours and on weekends, please call the GI answering service at [phone] who will take a message and have the physician on call contact you.   Following lower endoscopy (colonoscopy or flexible sigmoidoscopy):  Excessive amounts of blood in the stool  Significant tenderness or worsening of abdominal pains  Swelling of the abdomen that is new, acute  Fever of 100F or higher  Following upper endoscopy (EGD)  Vomiting of blood or coffee ground material  New chest pain or pain under the shoulder blades  Painful or persistently difficult swallowing  New shortness of breath  Fever of 100F or higher  Black, tarry-looking stools  FOLLOW UP: If any biopsies were taken you will be contacted by phone or by letter within the next 1-3 weeks.  Call your gastroenterologist if you have not heard about the biopsies in 3 weeks.  Our staff will call the home number listed on your records the next business day following your procedure to check on you and address any questions or concerns that you may have at that time regarding the information given to you following your procedure. This is a courtesy call and so if there is no answer at the home number and we have not heard from you through the emergency physician on call, we will assume that you have returned to your regular daily activities without incident.  SIGNATURES/CONFIDENTIALITY: You and/or your care   partner have signed paperwork which will be entered into your electronic medical record.  These signatures attest to the fact that that the information above on your After Visit Summary has been reviewed and is understood.  Full responsibility of the confidentiality of this discharge information lies with you and/or your care-partner.   Information on polyps,divertriculosis ,hemorrhoids ,and high fiber diet given to you today.

## 2013-04-28 NOTE — Progress Notes (Signed)
Called to room to assist during endoscopic procedure.  Patient ID and intended procedure confirmed with present staff. Received instructions for my participation in the procedure from the performing physician.  

## 2013-04-28 NOTE — Op Note (Signed)
[place]  [address] [place], [id number]   COLONOSCOPY PROCEDURE REPORT PATIENT: Heidi Hoffman, Heidi Hoffman  MR#: [id number] BIRTHDATE: Dec 02, 1932 , 74  yrs. old GENDER: Female ENDOSCOPIST: Ladene Artist, MD, [facility] PROCEDURE DATE:  04/28/2013 PROCEDURE:   Colonoscopy with biopsy and snare polypectomy First Screening Colonoscopy - Avg.  risk and is 50 yrs.  old or older - No.  Prior Negative Screening - Now for repeat screening. N/A  History of Adenoma - Now for follow-up colonoscopy & has been > or = to 3 yrs.  Yes hx of adenoma.  Has been 3 or more years since last colonoscopy.  Polyps Removed Today? Yes. ASA CLASS:   Class II INDICATIONS:Patient's personal history of adenomatous colon polyps.  MEDICATIONS: MAC sedation, administered by CRNA and propofol (Diprivan) 200mg  IV DESCRIPTION OF PROCEDURE:   After the risks benefits and alternatives of the procedure were thoroughly explained, informed consent was obtained.  A digital rectal exam revealed no abnormalities of the rectum.   The LB [id number]  endoscope was introduced through the anus and advanced to the cecum, which was identified by both the appendix and ileocecal valve. No adverse events experienced.   The quality of the prep was [place]opik good The instrument was then slowly withdrawn as the colon was fully examined.  COLON FINDINGS: A sessile polyp measuring 4 mm in size was found in the ascending colon.  A polypectomy was performed with cold forceps.  The resection was complete and the polyp tissue was completely retrieved.  A sessile polyp measuring 6 mm was found in the sigmoid colon. A polypectomy was performed by snare without cautery. The resection was complete and the polyp tissue was completely removed. Moderate diverticulosis was noted in the ascending colon, descending colon, and sigmoid colon.   The colon was otherwise normal.  There was no diverticulosis, inflammation, polyps or cancers  unless previously stated.  Retroflexed views revealed small internal hemorrhoids. The time to cecum=2 minutes 19 seconds.  Withdrawal time=9 minutes 20 seconds.  The scope was withdrawn and the procedure completed. COMPLICATIONS: There were no complications. ENDOSCOPIC IMPRESSION: 1.   Sessile polyp measuring 4 mm in the ascending colon; polypectomy performed with cold forceps 2.   Sessile polyp measuring 6 mm in the sigmoid colon; polypectomy performed with snare without cautery 2.   Moderate diverticulosis in the ascending colon, descending colon, and sigmoid colon 3.   Small internal hemorrhoids  RECOMMENDATIONS: 1.  Await pathology results 2.  High fiber diet with liberal fluid intake. 3.  Repeat Colonoscopy in 5 years.  eSigned:  Ladene Artist, MD, [facility] 04/28/2013 11:03 AM

## 2013-04-29 ENCOUNTER — Telehealth: Payer: Self-pay | Admitting: *Deleted

## 2013-04-29 NOTE — Telephone Encounter (Signed)
  Follow up Call-  Call back number 04/28/2013  Post procedure Call Back phone  # [phone] hm  Permission to leave phone message Yes     Patient questions:  Do you have a fever, pain , or abdominal swelling? no Pain Score  0 *  Have you tolerated food without any problems? yes  Have you been able to return to your normal activities? yes  Do you have any questions about your discharge instructions: Diet   no Medications  no Follow up visit  no  Do you have questions or concerns about your Care? no  Actions: * If pain score is 4 or above: No action needed, pain <4.

## 2013-05-04 ENCOUNTER — Encounter: Payer: Self-pay | Admitting: Gastroenterology

## 2013-06-07 NOTE — Progress Notes (Signed)
htn- tolerating meds  Lipids- has not requrired meds  copd-- uses albuterol neb and flovent  Past Medical History  Diagnosis Date  . Asthma   . GERD (gastroesophageal reflux disease)   . Hyperlipidemia   . Hypertension   . Osteoporosis   . COPD (chronic obstructive pulmonary disease)   . Allergy   . Anemia   . Diverticulosis   . Tubular adenoma of colon 10/01    History   Social History  . Marital Status: Single    Spouse Name: N/A    Number of Children: N/A  . Years of Education: N/A   Occupational History  . Not on file.   Social History Main Topics  . Smoking status: Former Smoker    Quit date: 05/09/2000  . Smokeless tobacco: Never Used  . Alcohol Use: 0.6 oz/week    1 Glasses of wine per week     Comment: occ.  . Drug Use: No  . Sexual Activity: Not Currently   Other Topics Concern  . Not on file   Social History Narrative  . No narrative on file    Past Surgical History  Procedure Laterality Date  . Cardiac catheterization  2001  . Total hip arthroplasty  11/20/2011    Procedure: TOTAL HIP ARTHROPLASTY;  Surgeon: Tobi Bastos, MD;  Location: [facility];  Service: Orthopedics;  Laterality: Left;    Family History  Problem Relation Age of Onset  . Heart disease Mother   . Diabetes Sister   . Asthma Daughter   . Asthma Son   . Colon cancer Neg Hx   . Esophageal cancer Neg Hx   . Rectal cancer Neg Hx   . Stomach cancer Neg Hx   . Pancreatic cancer Neg Hx     No Known Allergies  Current Outpatient Prescriptions on File Prior to Visit  Medication Sig Dispense Refill  . aspirin 81 MG tablet Take 81 mg by mouth daily.      . Calcium-Vitamin D (CALTRATE 600 PLUS-VIT D PO) Take by mouth daily.      Marland Kitchen FLOVENT HFA 44 MCG/ACT inhaler Inhale one puff by mouth twice daily  10.6 g  11  . losartan-hydrochlorothiazide (HYZAAR) 100-25 MG per tablet TAKE ONE TABLET BY MOUTH EVERY MORNING   90 tablet  1  . Multiple Vitamins-Minerals (CENTRUM SILVER ULTRA  WOMENS PO) Take by mouth.       No current facility-administered medications on file prior to visit.     patient denies chest pain, shortness of breath, orthopnea. Denies lower extremity edema, abdominal pain, change in appetite, change in bowel movements. Patient denies rashes, musculoskeletal complaints. No other specific complaints in a complete review of systems.   BP 140/80  Pulse 72  Temp(Src) 98 F (36.7 C) (Oral)  Ht 5\' 4"  (1.626 m)  Wt 200 lb (90.719 kg)  BMI 34.31 kg/m2  Well-developed well-nourished female in no acute distress. HEENT exam atraumatic, normocephalic, extraocular muscles are intact. Neck is supple. No jugular venous distention no thyromegaly. Chest clear to auscultation without increased work of breathing. Cardiac exam S1 and S2 are regular. Abdominal exam active bowel sounds, soft, nontender. Extremities no edema. Neurologic exam she is alert without any motor sensory deficits. Gait is normal.   HYPERTENSION BP: 140/80 mmHg  Adequate control Continue same meds  COPD She has minimal sxs Rare use of albuterol  ANEMIA Will check labs today

## 2013-06-08 ENCOUNTER — Ambulatory Visit (INDEPENDENT_AMBULATORY_CARE_PROVIDER_SITE_OTHER): Payer: Medicare Other | Admitting: Internal Medicine

## 2013-06-08 ENCOUNTER — Encounter: Payer: Self-pay | Admitting: Internal Medicine

## 2013-06-08 VITALS — BP 140/80 | HR 72 | Temp 98.0°F | Ht 64.0 in | Wt 200.0 lb

## 2013-06-08 DIAGNOSIS — E785 Hyperlipidemia, unspecified: Secondary | ICD-10-CM | POA: Diagnosis not present

## 2013-06-08 DIAGNOSIS — D649 Anemia, unspecified: Secondary | ICD-10-CM

## 2013-06-08 DIAGNOSIS — I10 Essential (primary) hypertension: Secondary | ICD-10-CM

## 2013-06-08 DIAGNOSIS — J449 Chronic obstructive pulmonary disease, unspecified: Secondary | ICD-10-CM

## 2013-06-08 LAB — CBC WITH DIFFERENTIAL/PLATELET
BASOS PCT: 0.8 % (ref 0.0–3.0)
Basophils Absolute: 0.1 10*3/uL (ref 0.0–0.1)
EOS ABS: 0.2 10*3/uL (ref 0.0–0.7)
EOS PCT: 3 % (ref 0.0–5.0)
HEMATOCRIT: 32.9 % — AB (ref 36.0–46.0)
HEMOGLOBIN: 10.6 g/dL — AB (ref 12.0–15.0)
LYMPHS ABS: 2.6 10*3/uL (ref 0.7–4.0)
Lymphocytes Relative: 34.2 % (ref 12.0–46.0)
MCHC: 32.3 g/dL (ref 30.0–36.0)
MCV: 89.3 fl (ref 78.0–100.0)
MONO ABS: 0.5 10*3/uL (ref 0.1–1.0)
Monocytes Relative: 6.6 % (ref 3.0–12.0)
NEUTROS ABS: 4.2 10*3/uL (ref 1.4–7.7)
Neutrophils Relative %: 55.4 % (ref 43.0–77.0)
Platelets: 294 10*3/uL (ref 150.0–400.0)
RBC: 3.69 Mil/uL — AB (ref 3.87–5.11)
RDW: 14.8 % (ref 11.5–15.5)
WBC: 7.6 10*3/uL (ref 4.0–10.5)

## 2013-06-08 LAB — BASIC METABOLIC PANEL
BUN: 13 mg/dL (ref 6–23)
CHLORIDE: 102 meq/L (ref 96–112)
CO2: 27 mEq/L (ref 19–32)
Calcium: 11.2 mg/dL — ABNORMAL HIGH (ref 8.4–10.5)
Creatinine, Ser: 1.1 mg/dL (ref 0.4–1.2)
GFR: 64.4 mL/min (ref >60.00)
Glucose, Bld: 95 mg/dL (ref 70–99)
POTASSIUM: 3.6 meq/L (ref 3.5–5.1)
Sodium: 137 mEq/L (ref 135–145)

## 2013-06-08 LAB — TSH: TSH: 5.61 u[IU]/mL — ABNORMAL HIGH (ref 0.35–4.50)

## 2013-06-08 MED ORDER — ALBUTEROL SULFATE (2.5 MG/3ML) 0.083% IN NEBU: 2.5000 mg | INHALATION_SOLUTION | Freq: Four times a day (QID) | RESPIRATORY_TRACT | Status: DC | PRN

## 2013-06-08 NOTE — Progress Notes (Signed)
Pre visit review using our clinic review tool, if applicable. No additional management support is needed unless otherwise documented below in the visit note. 

## 2013-06-09 ENCOUNTER — Telehealth: Payer: Self-pay | Admitting: Internal Medicine

## 2013-06-09 NOTE — Telephone Encounter (Signed)
Relevant patient education mailed to patient.  

## 2013-06-10 NOTE — Assessment & Plan Note (Signed)
She has minimal sxs Rare use of albuterol

## 2013-06-10 NOTE — Assessment & Plan Note (Signed)
Will check labs today

## 2013-06-10 NOTE — Assessment & Plan Note (Signed)
BP: 140/80 mmHg  Adequate control Continue same meds

## 2013-06-23 ENCOUNTER — Other Ambulatory Visit: Payer: Self-pay | Admitting: Internal Medicine

## 2013-08-03 ENCOUNTER — Encounter: Payer: Self-pay | Admitting: Family

## 2013-08-03 ENCOUNTER — Ambulatory Visit (INDEPENDENT_AMBULATORY_CARE_PROVIDER_SITE_OTHER): Payer: Medicare Other | Admitting: Family

## 2013-08-03 VITALS — BP 120/64 | HR 74 | Ht 64.0 in | Wt 201.0 lb

## 2013-08-03 DIAGNOSIS — E039 Hypothyroidism, unspecified: Secondary | ICD-10-CM

## 2013-08-03 DIAGNOSIS — I10 Essential (primary) hypertension: Secondary | ICD-10-CM | POA: Diagnosis not present

## 2013-08-03 DIAGNOSIS — D508 Other iron deficiency anemias: Secondary | ICD-10-CM | POA: Diagnosis not present

## 2013-08-03 NOTE — Progress Notes (Signed)
Subjective:    Patient ID: Heidi Hoffman, female    DOB: [birth year], 80 y.o.   MRN: [id number]  HPI 80 year old African American female, nonsmoker with a history of hypertension, hyperlipidemia and anemia is in today for recheck of low hemoglobin and high TSH from one month ago. Denies any history of hypothyroidism. Does have a history significant for anemia. Had a colonoscopy in April of this year that was normal. No known cause for low hemoglobin at this point.   Review of Systems  Constitutional: Negative.   HENT: Negative.   Cardiovascular: Negative.   Gastrointestinal: Negative.   Endocrine: Negative.   Genitourinary: Negative.   Musculoskeletal: Negative.   Skin: Negative.   Allergic/Immunologic: Negative.   Neurological: Negative.   Psychiatric/Behavioral: Negative.    Past Medical History  Diagnosis Date  . Asthma   . GERD (gastroesophageal reflux disease)   . Hyperlipidemia   . Hypertension   . Osteoporosis   . COPD (chronic obstructive pulmonary disease)   . Allergy   . Anemia   . Diverticulosis   . Tubular adenoma of colon 10/01    History   Social History  . Marital Status: Single    Spouse Name: N/A    Number of Children: N/A  . Years of Education: N/A   Occupational History  . Not on file.   Social History Main Topics  . Smoking status: Former Smoker    Quit date: 05/09/2000  . Smokeless tobacco: Never Used  . Alcohol Use: 0.6 oz/week    1 Glasses of wine per week     Comment: occ.  . Drug Use: No  . Sexual Activity: Not Currently   Other Topics Concern  . Not on file   Social History Narrative  . No narrative on file    Past Surgical History  Procedure Laterality Date  . Cardiac catheterization  2001  . Total hip arthroplasty  11/20/2011    Procedure: TOTAL HIP ARTHROPLASTY;  Surgeon: Tobi Bastos, MD;  Location: [facility];  Service: Orthopedics;  Laterality: Left;    Family History  Problem Relation Age of Onset  . Heart  disease Mother   . Diabetes Sister   . Asthma Daughter   . Asthma Son   . Colon cancer Neg Hx   . Esophageal cancer Neg Hx   . Rectal cancer Neg Hx   . Stomach cancer Neg Hx   . Pancreatic cancer Neg Hx     No Known Allergies  Current Outpatient Prescriptions on File Prior to Visit  Medication Sig Dispense Refill  . albuterol (PROVENTIL) (2.5 MG/3ML) 0.083% nebulizer solution Take 3 mLs (2.5 mg total) by nebulization every 6 (six) hours as needed for wheezing.  150 mL  1  . aspirin 81 MG tablet Take 81 mg by mouth daily.      . Calcium-Vitamin D (CALTRATE 600 PLUS-VIT D PO) Take by mouth daily.      Marland Kitchen FLOVENT HFA 44 MCG/ACT inhaler Inhale one puff by mouth twice daily  10.6 g  11  . losartan-hydrochlorothiazide (HYZAAR) 100-25 MG per tablet TAKE ONE TABLET BY MOUTH EVERY MORNING   90 tablet  3  . Multiple Vitamins-Minerals (CENTRUM SILVER ULTRA WOMENS PO) Take by mouth.       No current facility-administered medications on file prior to visit.    BP 120/64  Pulse 74  Ht 5\' 4"  (1.626 m)  Wt 201 lb (91.173 kg)  BMI 34.48 kg/m2chart    Objective:  Physical Exam  Constitutional: She is oriented to person, place, and time. She appears well-developed and well-nourished.  Neck: Normal range of motion. Neck supple.  Cardiovascular: Normal rate, regular rhythm and normal heart sounds.   Pulmonary/Chest: Effort normal and breath sounds normal.  Abdominal: Soft. Bowel sounds are normal.  Musculoskeletal: Normal range of motion.  Neurological: She is alert and oriented to person, place, and time.  Skin: Skin is warm and dry.  Psychiatric: She has a normal mood and affect.          Assessment & Plan:  Gerene was seen today for follow-up.  Diagnoses and associated orders for this visit:  Unspecified essential hypertension  Other specified iron deficiency anemias - CBC with Differential - Iron and TIBC  Unspecified hypothyroidism - TSH   Call the office with any  questions or concerns. Discussed further treatment plan pending results.

## 2013-08-03 NOTE — Progress Notes (Signed)
Pre visit review using our clinic review tool, if applicable. No additional management support is needed unless otherwise documented below in the visit note. 

## 2013-08-03 NOTE — Patient Instructions (Signed)
Anemia, Nonspecific Anemia is a condition in which the concentration of red blood cells or hemoglobin in the blood is below normal. Hemoglobin is a substance in red blood cells that carries oxygen to the tissues of the body. Anemia results in not enough oxygen reaching these tissues.  CAUSES  Common causes of anemia include:   Excessive bleeding. Bleeding may be internal or external. This includes excessive bleeding from periods (in women) or from the intestine.   Poor nutrition.   Chronic kidney, thyroid, and liver disease.  Bone marrow disorders that decrease red blood cell production.  Cancer and treatments for cancer.  HIV, AIDS, and their treatments.  Spleen problems that increase red blood cell destruction.  Blood disorders.  Excess destruction of red blood cells due to infection, medicines, and autoimmune disorders. SIGNS AND SYMPTOMS   Minor weakness.   Dizziness.   Headache.  Palpitations.   Shortness of breath, especially with exercise.   Paleness.  Cold sensitivity.  Indigestion.  Nausea.  Difficulty sleeping.  Difficulty concentrating. Symptoms may occur suddenly or they may develop slowly.  DIAGNOSIS  Additional blood tests are often needed. These help your health care provider determine the best treatment. Your health care provider will check your stool for blood and look for other causes of blood loss.  TREATMENT  Treatment varies depending on the cause of the anemia. Treatment can include:   Supplements of iron, vitamin B12, or folic acid.   Hormone medicines.   A blood transfusion. This may be needed if blood loss is severe.   Hospitalization. This may be needed if there is significant continual blood loss.   Dietary changes.  Spleen removal. HOME CARE INSTRUCTIONS Keep all follow-up appointments. It often takes many weeks to correct anemia, and having your health care provider check on your condition and your response to  treatment is very important. SEEK IMMEDIATE MEDICAL CARE IF:   You develop extreme weakness, shortness of breath, or chest pain.   You become dizzy or have trouble concentrating.  You develop heavy vaginal bleeding.   You develop a rash.   You have bloody or black, tarry stools.   You faint.   You vomit up blood.   You vomit repeatedly.   You have abdominal pain.  You have a fever or persistent symptoms for more than 2-3 days.   You have a fever and your symptoms suddenly get worse.   You are dehydrated.  MAKE SURE YOU:  Understand these instructions.  Will watch your condition.  Will get help right away if you are not doing well or get worse. Document Released: 02/02/2004 Document Revised: 08/27/2012 Document Reviewed: 06/20/2012 Exit[employer] Patient Information 2015 [employer]. This information is not intended to replace advice given to you by your health care provider. Make sure you discuss any questions you have with your health care provider.  

## 2013-08-04 LAB — CBC WITH DIFFERENTIAL/PLATELET
Basophils Absolute: 0 10*3/uL (ref 0.0–0.1)
Basophils Relative: 0.3 % (ref 0.0–3.0)
EOS ABS: 0.3 10*3/uL (ref 0.0–0.7)
EOS PCT: 2.8 % (ref 0.0–5.0)
HEMATOCRIT: 30.8 % — AB (ref 36.0–46.0)
Hemoglobin: 10.1 g/dL — ABNORMAL LOW (ref 12.0–15.0)
LYMPHS ABS: 2.2 10*3/uL (ref 0.7–4.0)
Lymphocytes Relative: 23.5 % (ref 12.0–46.0)
MCHC: 32.8 g/dL (ref 30.0–36.0)
MCV: 89 fl (ref 78.0–100.0)
Monocytes Absolute: 0.5 10*3/uL (ref 0.1–1.0)
Monocytes Relative: 5.9 % (ref 3.0–12.0)
NEUTROS PCT: 67.5 % (ref 43.0–77.0)
Neutro Abs: 6.2 10*3/uL (ref 1.4–7.7)
Platelets: 286 10*3/uL (ref 150.0–400.0)
RBC: 3.46 Mil/uL — ABNORMAL LOW (ref 3.87–5.11)
RDW: 15 % (ref 11.5–15.5)
WBC: 9.2 10*3/uL (ref 4.0–10.5)

## 2013-08-04 LAB — TSH: TSH: 0.54 u[IU]/mL (ref 0.35–4.50)

## 2013-08-04 LAB — IRON AND TIBC
%SAT: 21 % (ref 20–55)
Iron: 59 ug/dL (ref 42–145)
TIBC: 284 ug/dL (ref 250–470)
UIBC: 225 ug/dL (ref 125–400)

## 2013-08-12 ENCOUNTER — Encounter: Payer: Self-pay | Admitting: Gastroenterology

## 2013-08-12 IMAGING — CR DG HIP 1V PORT*L*
1 series · 1 of 1 positions shown · non-contrast
Comparison: None.

CLINICAL DATA: Postop left hip.

PORTABLE LEFT HIP - 1 VIEW

[AP]
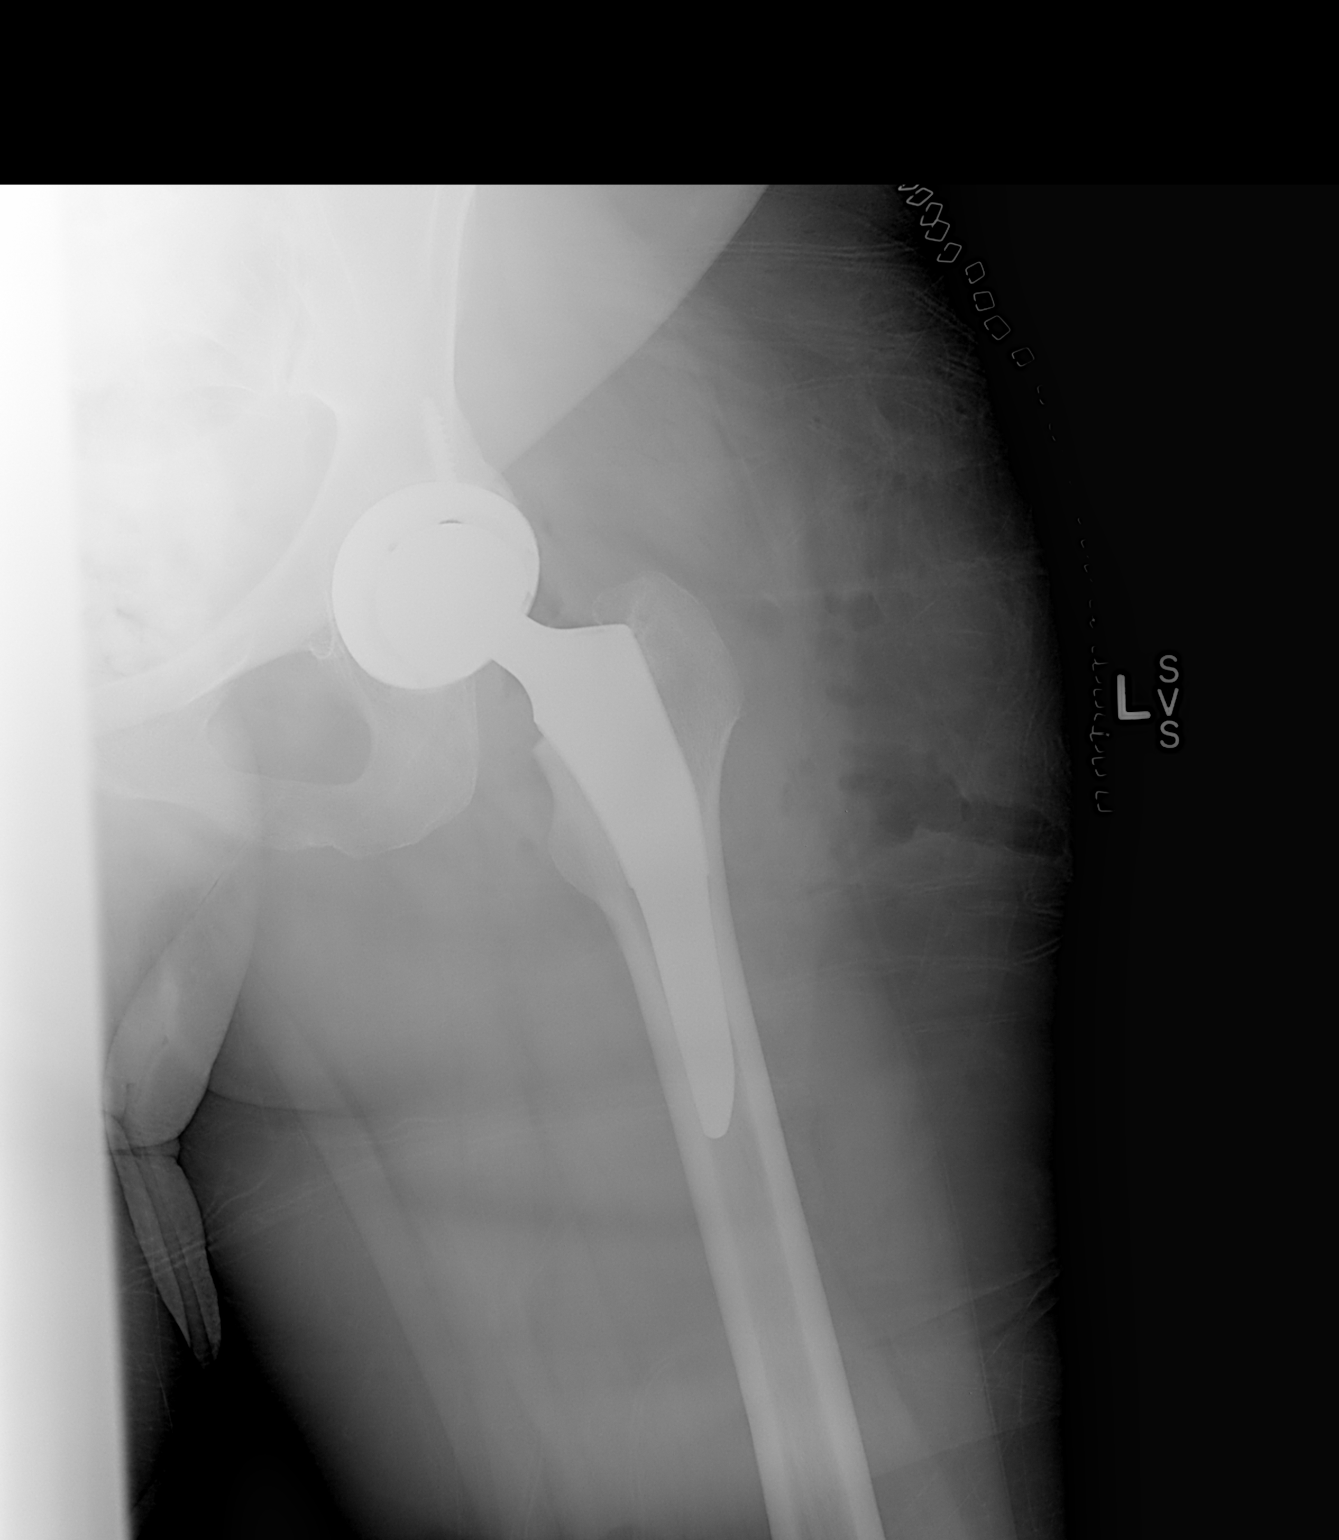

[1 of 1 positions shown; findings below may reference images not displayed]

FINDINGS: Post total left hip replacement which appears in
satisfactory position on this single projection without
complication noted.
IMPRESSION: Post total left hip replacement which appears in satisfactory
position on this single projection without complication noted.]

## 2014-02-24 ENCOUNTER — Other Ambulatory Visit: Payer: Self-pay | Admitting: Internal Medicine

## 2014-05-24 ENCOUNTER — Ambulatory Visit (INDEPENDENT_AMBULATORY_CARE_PROVIDER_SITE_OTHER): Payer: Medicare Other | Admitting: Adult Health

## 2014-05-24 ENCOUNTER — Encounter: Payer: Self-pay | Admitting: Adult Health

## 2014-05-24 ENCOUNTER — Telehealth: Payer: Self-pay | Admitting: Adult Health

## 2014-05-24 VITALS — BP 138/84 | HR 73 | Temp 98.0°F | Wt 198.3 lb

## 2014-05-24 DIAGNOSIS — I10 Essential (primary) hypertension: Secondary | ICD-10-CM | POA: Diagnosis not present

## 2014-05-24 DIAGNOSIS — Z7189 Other specified counseling: Secondary | ICD-10-CM

## 2014-05-24 DIAGNOSIS — Z23 Encounter for immunization: Secondary | ICD-10-CM

## 2014-05-24 DIAGNOSIS — J4541 Moderate persistent asthma with (acute) exacerbation: Secondary | ICD-10-CM | POA: Diagnosis not present

## 2014-05-24 DIAGNOSIS — Z7689 Persons encountering health services in other specified circumstances: Secondary | ICD-10-CM

## 2014-05-24 DIAGNOSIS — M810 Age-related osteoporosis without current pathological fracture: Secondary | ICD-10-CM

## 2014-05-24 MED ORDER — ALBUTEROL SULFATE 108 (90 BASE) MCG/ACT IN AEPB: 108.0000 ug | INHALATION_SPRAY | RESPIRATORY_TRACT | Status: DC | PRN

## 2014-05-24 MED ORDER — PREDNISONE 20 MG PO TABS: 20.0000 mg | ORAL_TABLET | Freq: Every day | ORAL | Status: DC

## 2014-05-24 MED ORDER — ALBUTEROL SULFATE 108 (90 BASE) MCG/ACT IN AEPB: 108.0000 ug | INHALATION_SPRAY | Freq: Three times a day (TID) | RESPIRATORY_TRACT | Status: DC | PRN

## 2014-05-24 MED ORDER — FLUTICASONE PROPIONATE HFA 44 MCG/ACT IN AERO: 1.0000 | INHALATION_SPRAY | Freq: Two times a day (BID) | RESPIRATORY_TRACT | Status: DC

## 2014-05-24 NOTE — Progress Notes (Signed)
Pre visit review using our clinic review tool, if applicable. No additional management support is needed unless otherwise documented below in the visit note. 

## 2014-05-24 NOTE — Patient Instructions (Addendum)
I have sent prescriptions to the pharmacy for the Flovent, Albuteral Inhaler and Prednisone.   For the prednisone taper, please take as directed ( $RemoveBe'40mg'MkFjAASdF$ '40mg'$ , $Rem'40mg'djSS$ , 20 mg, $Remo'20mg'RPiIA$ , 10 mg, 10 mg).   The office will call you to set up an appointment for the bone density screening. You need to make an appointment for your mammogram.   It was great meeting you today and I look forward to seeing you in June for your complete physical. Please let me know if you need anything in the meantime.    Health Maintenance Adopting a healthy lifestyle and getting preventive care can go a long way to promote health and wellness. Talk with your health care provider about what schedule of regular examinations is right for you. This is a good chance for you to check in with your provider about disease prevention and staying healthy. In between checkups, there are plenty of things you can do on your own. Experts have done a lot of research about which lifestyle changes and preventive measures are most likely to keep you healthy. Ask your health care provider for more information. WEIGHT AND DIET  Eat a healthy diet  Be sure to include plenty of vegetables, fruits, low-fat dairy products, and lean protein.  Do not eat a lot of foods high in solid fats, added sugars, or salt.  Get regular exercise. This is one of the most important things you can do for your health.  Most adults should exercise for at least 150 minutes each week. The exercise should increase your heart rate and make you sweat (moderate-intensity exercise).  Most adults should also do strengthening exercises at least twice a week. This is in addition to the moderate-intensity exercise.  Maintain a healthy weight  Body mass index (BMI) is a measurement that can be used to identify possible weight problems. It estimates body fat based on height and weight. Your health care provider can help determine your BMI and help you achieve or maintain a healthy  weight.  For females 29 years of age and older:   A BMI below 18.5 is considered underweight.  A BMI of 18.5 to 24.9 is normal.  A BMI of 25 to 29.9 is considered overweight.  A BMI of 30 and above is considered obese.  Watch levels of cholesterol and blood lipids  You should start having your blood tested for lipids and cholesterol at 79 years of age, then have this test every 5 years.  You may need to have your cholesterol levels checked more often if:  Your lipid or cholesterol levels are high.  You are older than 79 years of age.  You are at high risk for heart disease.  CANCER SCREENING   Lung Cancer  Lung cancer screening is recommended for adults 23-62 years old who are at high risk for lung cancer because of a history of smoking.  A yearly low-dose CT scan of the lungs is recommended for people who:  Currently smoke.  Have quit within the past 15 years.  Have at least a 30-pack-year history of smoking. A pack year is smoking an average of one pack of cigarettes a day for 1 year.  Yearly screening should continue until it has been 15 years since you quit.  Yearly screening should stop if you develop a health problem that would prevent you from having lung cancer treatment.  Breast Cancer  Practice breast self-awareness. This means understanding how your breasts normally appear and feel.  It also means doing regular breast self-exams. Let your health care provider know about any changes, no matter how small.  If you are in your 20s or 30s, you should have a clinical breast exam (CBE) by a health care provider every 1-3 years as part of a regular health exam.  If you are 75 or older, have a CBE every year. Also consider having a breast X-ray (mammogram) every year.  If you have a family history of breast cancer, talk to your health care provider about genetic screening.  If you are at high risk for breast cancer, talk to your health care provider about  having an MRI and a mammogram every year.  Breast cancer gene (BRCA) assessment is recommended for women who have family members with BRCA-related cancers. BRCA-related cancers include:  Breast.  Ovarian.  Tubal.  Peritoneal cancers.  Results of the assessment will determine the need for genetic counseling and BRCA1 and BRCA2 testing. Cervical Cancer Routine pelvic examinations to screen for cervical cancer are no longer recommended for nonpregnant women who are considered low risk for cancer of the pelvic organs (ovaries, uterus, and vagina) and who do not have symptoms. A pelvic examination may be necessary if you have symptoms including those associated with pelvic infections. Ask your health care provider if a screening pelvic exam is right for you.   The Pap test is the screening test for cervical cancer for women who are considered at risk.  If you had a hysterectomy for a problem that was not cancer or a condition that could lead to cancer, then you no longer need Pap tests.  If you are older than 65 years, and you have had normal Pap tests for the past 10 years, you no longer need to have Pap tests.  If you have had past treatment for cervical cancer or a condition that could lead to cancer, you need Pap tests and screening for cancer for at least 20 years after your treatment.  If you no longer get a Pap test, assess your risk factors if they change (such as having a new sexual partner). This can affect whether you should start being screened again.  Some women have medical problems that increase their chance of getting cervical cancer. If this is the case for you, your health care provider may recommend more frequent screening and Pap tests.  The human papillomavirus (HPV) test is another test that may be used for cervical cancer screening. The HPV test looks for the virus that can cause cell changes in the cervix. The cells collected during the Pap test can be tested for  HPV.  The HPV test can be used to screen women 24 years of age and older. Getting tested for HPV can extend the interval between normal Pap tests from three to five years.  An HPV test also should be used to screen women of any age who have unclear Pap test results.  After 79 years of age, women should have HPV testing as often as Pap tests.  Colorectal Cancer  This type of cancer can be detected and often prevented.  Routine colorectal cancer screening usually begins at 79 years of age and continues through 79 years of age.  Your health care provider may recommend screening at an earlier age if you have risk factors for colon cancer.  Your health care provider may also recommend using home test kits to check for hidden blood in the stool.  A small camera at the  end of a tube can be used to examine your colon directly (sigmoidoscopy or colonoscopy). This is done to check for the earliest forms of colorectal cancer.  Routine screening usually begins at age 71.  Direct examination of the colon should be repeated every 5-10 years through 79 years of age. However, you may need to be screened more often if early forms of precancerous polyps or small growths are found. Skin Cancer  Check your skin from head to toe regularly.  Tell your health care provider about any new moles or changes in moles, especially if there is a change in a mole's shape or color.  Also tell your health care provider if you have a mole that is larger than the size of a pencil eraser.  Always use sunscreen. Apply sunscreen liberally and repeatedly throughout the day.  Protect yourself by wearing long sleeves, pants, a wide-brimmed hat, and sunglasses whenever you are outside. HEART DISEASE, DIABETES, AND HIGH BLOOD PRESSURE   Have your blood pressure checked at least every 1-2 years. High blood pressure causes heart disease and increases the risk of stroke.  If you are between 44 years and 28 years old, ask  your health care provider if you should take aspirin to prevent strokes.  Have regular diabetes screenings. This involves taking a blood sample to check your fasting blood sugar level.  If you are at a normal weight and have a low risk for diabetes, have this test once every three years after 79 years of age.  If you are overweight and have a high risk for diabetes, consider being tested at a younger age or more often. PREVENTING INFECTION  Hepatitis B  If you have a higher risk for hepatitis B, you should be screened for this virus. You are considered at high risk for hepatitis B if:  You were born in a country where hepatitis B is common. Ask your health care provider which countries are considered high risk.  Your parents were born in a high-risk country, and you have not been immunized against hepatitis B (hepatitis B vaccine).  You have HIV or AIDS.  You use needles to inject street drugs.  You live with someone who has hepatitis B.  You have had sex with someone who has hepatitis B.  You get hemodialysis treatment.  You take certain medicines for conditions, including cancer, organ transplantation, and autoimmune conditions. Hepatitis C  Blood testing is recommended for:  Everyone born from [birth year] through 1963.  Anyone with known risk factors for hepatitis C. Sexually transmitted infections (STIs)  You should be screened for sexually transmitted infections (STIs) including gonorrhea and chlamydia if:  You are sexually active and are younger than 79 years of age.  You are older than 79 years of age and your health care provider tells you that you are at risk for this type of infection.  Your sexual activity has changed since you were last screened and you are at an increased risk for chlamydia or gonorrhea. Ask your health care provider if you are at risk.  If you do not have HIV, but are at risk, it may be recommended that you take a prescription medicine daily to  prevent HIV infection. This is called pre-exposure prophylaxis (PrEP). You are considered at risk if:  You are sexually active and do not regularly use condoms or know the HIV status of your partner(s).  You take drugs by injection.  You are sexually active with a partner who has  HIV. Talk with your health care provider about whether you are at high risk of being infected with HIV. If you choose to begin PrEP, you should first be tested for HIV. You should then be tested every 3 months for as long as you are taking PrEP.  PREGNANCY   If you are premenopausal and you may become pregnant, ask your health care provider about preconception counseling.  If you may become pregnant, take 400 to 800 micrograms (mcg) of folic acid every day.  If you want to prevent pregnancy, talk to your health care provider about birth control (contraception). OSTEOPOROSIS AND MENOPAUSE   Osteoporosis is a disease in which the bones lose minerals and strength with aging. This can result in serious bone fractures. Your risk for osteoporosis can be identified using a bone density scan.  If you are 75 years of age or older, or if you are at risk for osteoporosis and fractures, ask your health care provider if you should be screened.  Ask your health care provider whether you should take a calcium or vitamin D supplement to lower your risk for osteoporosis.  Menopause may have certain physical symptoms and risks.  Hormone replacement therapy may reduce some of these symptoms and risks. Talk to your health care provider about whether hormone replacement therapy is right for you.  HOME CARE INSTRUCTIONS   Schedule regular health, dental, and eye exams.  Stay current with your immunizations.   Do not use any tobacco products including cigarettes, chewing tobacco, or electronic cigarettes.  If you are pregnant, do not drink alcohol.  If you are breastfeeding, limit how much and how often you drink  alcohol.  Limit alcohol intake to no more than 1 drink per day for nonpregnant women. One drink equals 12 ounces of beer, 5 ounces of wine, or 1 ounces of hard liquor.  Do not use street drugs.  Do not share needles.  Ask your health care provider for help if you need support or information about quitting drugs.  Tell your health care provider if you often feel depressed.  Tell your health care provider if you have ever been abused or do not feel safe at home. Document Released: 07/10/2010 Document Revised: 05/11/2013 Document Reviewed: 11/26/2012 [facility] Patient Information 2015 [place], [place]. This information is not intended to replace advice given to you by your health care provider. Make sure you discuss any questions you have with your health care provider.

## 2014-05-24 NOTE — Progress Notes (Addendum)
Heidi Hoffman is here to establish care. She is a very pleasant AA female with the past medical history of hypertension, hyperlipidemia, anemia, COPD, and osteoarthritis of left hip   Last PCP and physical: May 2015 Immunizations:UTD Diet: Vegetables, chicken, pasta. Likes sweets and chocolate Exercise: Does not exercise, sometimes walks.  Colonoscopy: 04/2013 Dexa: 2013.  Mammogram:will make appointment  Dentist: has dentures Eye Doctor: Every two years.    HTN - Is controlled on current regimen.  - Denies any highs or lows.   Asthma - She endorses having asthma for most of her life.  - She does not use a rescue inhaler, but uses her nebulizer.  - Is wheezing for the last few days.   as the following chronic problems that require follow up and concerns today:  None  ROS negative for unless reported above: fevers, unintentional weight loss, hearing or vision loss, chest pain, palpitations, struggling to breath, hemoptysis, melena, hematochezia, hematuria, falls, loc, si, thoughts of self harm  Past Medical History  Diagnosis Date  . Asthma   . GERD (gastroesophageal reflux disease)   . Hyperlipidemia   . Hypertension   . Osteoporosis   . COPD (chronic obstructive pulmonary disease)   . Allergy   . Anemia   . Diverticulosis   . Tubular adenoma of colon 10/01    Past Surgical History  Procedure Laterality Date  . Cardiac catheterization  2001  . Total hip arthroplasty  11/20/2011    Procedure: TOTAL HIP ARTHROPLASTY;  Surgeon: Tobi Bastos, MD;  Location: [facility];  Service: Orthopedics;  Laterality: Left;    Family History  Problem Relation Age of Onset  . Heart disease Mother   . Diabetes Sister   . Asthma Daughter   . Asthma Son   . Colon cancer Neg Hx   . Esophageal cancer Neg Hx   . Rectal cancer Neg Hx   . Stomach cancer Neg Hx   . Pancreatic cancer Neg Hx     History   Social History  . Marital Status: Single    Spouse Name: N/A  . Number of  Children: N/A  . Years of Education: N/A   Social History Main Topics  . Smoking status: Former Smoker    Quit date: 05/09/2000  . Smokeless tobacco: Never Used  . Alcohol Use: 0.6 oz/week    1 Glasses of wine per week     Comment: occ.  . Drug Use: No  . Sexual Activity: Not Currently   Other Topics Concern  . None   Social History Narrative     Current outpatient prescriptions:  .  albuterol (PROVENTIL) (2.5 MG/3ML) 0.083% nebulizer solution, INHALE ONE VIAL VIA NEBULIZER EVERY SIX HOURS AS NEEDED for wheezing, Disp: 150 mL, Rfl: 0 .  aspirin 81 MG tablet, Take 81 mg by mouth daily., Disp: , Rfl:  .  Calcium-Vitamin D (CALTRATE 600 PLUS-VIT D PO), Take by mouth daily., Disp: , Rfl:  .  Docusate Calcium (STOOL SOFTENER PO), Take by mouth as needed., Disp: , Rfl:  .  FLOVENT HFA 44 MCG/ACT inhaler, Inhale one puff by mouth twice daily, Disp: 10.6 g, Rfl: 11 .  losartan-hydrochlorothiazide (HYZAAR) 100-25 MG per tablet, TAKE ONE TABLET BY MOUTH EVERY MORNING , Disp: 90 tablet, Rfl: 3 .  Multiple Vitamins-Minerals (CENTRUM SILVER ULTRA WOMENS PO), Take by mouth., Disp: , Rfl:   EXAM:  Filed Vitals:   05/24/14 1357  BP: 138/84  Pulse: 73  Temp: 98 F (36.7  C)    Body mass index is 34.02 kg/(m^2).  GENERAL: vitals reviewed and listed above, alert, oriented, appears well hydrated and in no acute distress. Obese  HEENT: atraumatic, conjunttiva clear, no obvious abnormalities on inspection of external nose and ears. Moderate cerumen noted in bilateral ear canals.   NECK: no obvious masses on inspection. Thyroid not enlarged.   LUNGS: clear to auscultation bilaterally, wheezing heard in all lobes.No rales or rhonchi, decent air movement. Audible wheezing.   CV: HRRR, no peripheral edema. No carotid bruit.   MS: moves all extremities without noticeable abnormality. No edema noted  Abd: soft/nontender/nondistended/normal bowel sounds. Obese around abdomen.   Skin: warm  and dry, no rash   Neuro: CN II-XII intact, sensation and reflexes normal throughout, 5/5 muscle strength in bilateral upper and lower extremities. Normal finger to nose. Normal rapid alternating movements. Normal romberg. No pronator drift.   PSYCH: pleasant and cooperative, no obvious depression or anxiety  ASSESSMENT AND PLAN:  Discussed the following assessment and plan:  1. Encounter to establish care - Follow up in June for CPE - Follow up sooner if needed - T Incorporate exercise into your lifestyle.   2. Essential hypertension - Continue to take current medications for HTN  3. Asthma with acute exacerbation, moderate persistent - fluticasone (FLOVENT HFA) 44 MCG/ACT inhaler; Inhale 1 puff into the lungs 2 (two) times daily.  Dispense: 10.6 g; Refill: 11 - Albuterol Sulfate (PROAIR RESPICLICK) 563 (90 BASE) MCG/ACT AEPB; Inhale 108 mcg into the lungs as needed.  Dispense: 1 each; Refill: 3 - predniSONE (DELTASONE) 20 MG tablet; Take 1 tablet (20 mg total) by mouth daily with breakfast.  Dispense: 9 tablet; Refill: 0 - Follow up if no improvement in 2-3 days.  - Go to the ER with severe SOB or the inability to swallow.   4. Osteoporosis - DG Bone Density; Future  5. Need for Streptococcus pneumoniae vaccination - Pneumococcal conjugate vaccine 13-valent IM     Encounter to establish care -We reviewed the PMH, PSH, FH, SH, Meds and Allergies. -We provided refills for any medications we will prescribe as needed. -We addressed current concerns per orders and patient instructions. -We have advised patient to follow up per instructions below.   -Patient advised to return or notify a provider immediately if symptoms worsen or persist or new concerns arise.  There are no Patient Instructions on file for this visit.   [employer]

## 2014-05-24 NOTE — Telephone Encounter (Signed)
Kindle from the pharmacy and she states they need a frequency for respiclick instead of prn.  Per Tommi Rumps change to tid prn.  Melanee Spry is aware.

## 2014-05-24 NOTE — Telephone Encounter (Signed)
Cordelia Pen called from [place] she has some questions about the following rx Albuterol Sulfate (PROAIR RESPICLICK) 147 (90 BASE) MCG/ACT AEPB  She would like a call back about directions   [phone]

## 2014-06-14 DIAGNOSIS — Z1231 Encounter for screening mammogram for malignant neoplasm of breast: Secondary | ICD-10-CM | POA: Diagnosis not present

## 2014-06-22 ENCOUNTER — Other Ambulatory Visit: Payer: Self-pay | Admitting: *Deleted

## 2014-06-22 MED ORDER — LOSARTAN POTASSIUM-HCTZ 100-25 MG PO TABS: 1.0000 | ORAL_TABLET | Freq: Every morning | ORAL | Status: DC

## 2014-06-23 ENCOUNTER — Encounter: Payer: Self-pay | Admitting: Family Medicine

## 2014-09-19 ENCOUNTER — Other Ambulatory Visit: Payer: Self-pay | Admitting: Adult Health

## 2014-11-01 ENCOUNTER — Ambulatory Visit (INDEPENDENT_AMBULATORY_CARE_PROVIDER_SITE_OTHER): Payer: Medicare Other | Admitting: Adult Health

## 2014-11-01 ENCOUNTER — Encounter: Payer: Self-pay | Admitting: Adult Health

## 2014-11-01 VITALS — BP 150/80 | Temp 98.0°F | Ht 64.0 in | Wt 197.1 lb

## 2014-11-01 DIAGNOSIS — I10 Essential (primary) hypertension: Secondary | ICD-10-CM | POA: Diagnosis not present

## 2014-11-01 DIAGNOSIS — Z23 Encounter for immunization: Secondary | ICD-10-CM

## 2014-11-01 DIAGNOSIS — J449 Chronic obstructive pulmonary disease, unspecified: Secondary | ICD-10-CM

## 2014-11-01 MED ORDER — FLUTICASONE FUROATE-VILANTEROL 100-25 MCG/INH IN AEPB
100.0000 ug | INHALATION_SPRAY | Freq: Every day | RESPIRATORY_TRACT | Status: DC
Start: 2014-11-01 — End: 2014-11-22

## 2014-11-01 MED ORDER — LISINOPRIL 10 MG PO TABS: 10.0000 mg | ORAL_TABLET | Freq: Every day | ORAL | Status: DC

## 2014-11-01 NOTE — Progress Notes (Signed)
Subjective:    Patient ID: Heidi Hoffman, female    DOB: [birth year], 81 y.o.   MRN: [id number]  HPI  81 year old female who presents to the office today for headaches and concern for her blood pressure. She has been having headaches for "acouple of weeks" The headaches are intermittent. She endorses that she usually does not get headaches. Headaches are located in the front of her head. She endorses feeling dizzy on occasion.Taking a tylenol will make the headache go away   She has not been checking her blood pressure on a regular basis but when she does it is elevated.   She also feels as though her COPD is getting worse. She has been having to use her rescue inhaler, nebulizer, and Flovent more often. This has been an ongoing issue but has been becoming worse. She is using her nebulizer 2-3 times a week. She has to sleep with the windows open or else she feels like she is sufficating   Review of Systems  Constitutional: Negative.   Respiratory: Positive for shortness of breath (chronic) and wheezing. Negative for cough, choking, chest tightness and stridor.   Cardiovascular: Negative.   Gastrointestinal: Negative.   Neurological: Positive for dizziness and headaches. Negative for seizures, facial asymmetry, weakness and numbness.  All other systems reviewed and are negative.  Past Medical History  Diagnosis Date  . Asthma   . GERD (gastroesophageal reflux disease)   . Hyperlipidemia   . Hypertension   . Osteoporosis   . COPD (chronic obstructive pulmonary disease) ([place])   . Allergy   . Anemia   . Diverticulosis   . Tubular adenoma of colon 10/01    Social History   Social History  . Marital Status: Single    Spouse Name: N/A  . Number of Children: N/A  . Years of Education: N/A   Occupational History  . Not on file.   Social History Main Topics  . Smoking status: Former Smoker    Quit date: 05/09/2000  . Smokeless tobacco: Never Used  . Alcohol Use: 0.6 oz/week     1 Glasses of wine per week     Comment: occ.  . Drug Use: No  . Sexual Activity: Not Currently   Other Topics Concern  . Not on file   Social History Narrative   -Patient has never been married.    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    - Likes to read and watch TV.            Past Surgical History  Procedure Laterality Date  . Cardiac catheterization  2001    She denies  . Total hip arthroplasty  11/20/2011    Procedure: TOTAL HIP ARTHROPLASTY;  Surgeon: Tobi Bastos, MD;  Location: [facility];  Service: Orthopedics;  Laterality: Left;    Family History  Problem Relation Age of Onset  . Heart disease Mother   . Diabetes Sister   . Asthma Daughter   . Asthma Son   . Colon cancer Neg Hx   . Esophageal cancer Neg Hx   . Rectal cancer Neg Hx   . Stomach cancer Neg Hx   . Pancreatic cancer Neg Hx     No Known Allergies  Current Outpatient Prescriptions on File Prior to Visit  Medication Sig Dispense Refill  . albuterol (  PROVENTIL) (2.5 MG/3ML) 0.083% nebulizer solution INHALE ONE VIAL VIA NEBULIZER EVERY SIX HOURS AS NEEDED for wheezing 150 mL 0  . Albuterol Sulfate (PROAIR RESPICLICK) 267 (90 BASE) MCG/ACT AEPB Inhale 108 mcg into the lungs 3 (three) times daily as needed. 1 each 3  . aspirin 81 MG tablet Take 81 mg by mouth daily.    . Calcium-Vitamin D (CALTRATE 600 PLUS-VIT D PO) Take by mouth daily.    Mariane Baumgarten Calcium (STOOL SOFTENER PO) Take by mouth as needed.    . fluticasone (FLOVENT HFA) 44 MCG/ACT inhaler Inhale 1 puff into the lungs 2 (two) times daily. 10.6 g 11  . losartan-hydrochlorothiazide (HYZAAR) 100-25 MG per tablet TAKE ONE TABLET BY MOUTH EVERY MORNING. 90 tablet 0  . Multiple Vitamins-Minerals (CENTRUM SILVER ULTRA WOMENS PO) Take by mouth.     No current facility-administered medications on file prior to visit.    BP 150/80 mmHg  Temp(Src) 98 F (36.7 C)  (Oral)  Ht 5\' 4"  (1.626 m)  Wt 197 lb 1.6 oz (89.404 kg)  BMI 33.82 kg/m2       Objective:   Physical Exam  Constitutional: She is oriented to person, place, and time. She appears well-developed and well-nourished. No distress.  Cardiovascular: Normal rate, regular rhythm, normal heart sounds and intact distal pulses.  Exam reveals no gallop and no friction rub.   No murmur heard. Pulmonary/Chest: Effort normal and breath sounds normal. No respiratory distress. She has no wheezes. She has no rales. She exhibits no tenderness.  Musculoskeletal: Normal range of motion.  Walks with cane     Neurological: She is alert and oriented to person, place, and time.  Skin: Skin is warm and dry. No rash noted. She is not diaphoretic. No erythema. No pallor.  Psychiatric: She has a normal mood and affect. Her behavior is normal. Judgment and thought content normal.  Nursing note and vitals reviewed.      Assessment & Plan:  1. Chronic obstructive pulmonary disease, unspecified COPD type (HCC) - Fluticasone Furoate-Vilanterol (BREO ELLIPTA) 100-25 MCG/INH AEPB; Inhale 100 mcg into the lungs daily.  Dispense: 28 each; Refill: 0 - Ambulatory referral to Pulmonology - D/c Flovent while trialing Ellipta  2. Essential hypertension - Likely the cause of her headache.  - lisinopril (PRINIVIL,ZESTRIL) 10 MG tablet; Take 1 tablet (10 mg total) by mouth daily.  Dispense: 90 tablet; Refill: 1 - Monitor at home and write down in log. Bring to next visit -  3. Encounter for immunization - High dose flu shot given

## 2014-11-01 NOTE — Progress Notes (Signed)
Pre visit review using our clinic review tool, if applicable. No additional management support is needed unless otherwise documented below in the visit note. 

## 2014-11-01 NOTE — Patient Instructions (Signed)
It was great seeing you today  Follow up with me on November 7th for your physical and to see how you are doing with your new medications  Monitor blood pressure at home and write them down, bring with you to the physical  Trial the new inhaler. Do not use the Flovenet while using Ellipta

## 2014-11-02 ENCOUNTER — Telehealth: Payer: Self-pay

## 2014-11-02 NOTE — Telephone Encounter (Signed)
Pt called with questions about her blood pressure medications. Advised pt that per Tommi Rumps she should be taking the losartan-hctz and lisinopril. Advised pt of her upcoming appt for a medicare wellness exam and to arrive early so that we can go over her paperwork. Pt verbalized understanding.  Also advised that she should fill out a DPR when she comes into the office.

## 2014-11-15 ENCOUNTER — Encounter: Payer: Self-pay | Admitting: Adult Health

## 2014-11-15 ENCOUNTER — Ambulatory Visit (INDEPENDENT_AMBULATORY_CARE_PROVIDER_SITE_OTHER): Payer: Medicare Other | Admitting: Adult Health

## 2014-11-15 VITALS — BP 160/80 | Temp 98.3°F | Ht 64.0 in | Wt 197.1 lb

## 2014-11-15 DIAGNOSIS — E785 Hyperlipidemia, unspecified: Secondary | ICD-10-CM | POA: Diagnosis not present

## 2014-11-15 DIAGNOSIS — Z0000 Encounter for general adult medical examination without abnormal findings: Secondary | ICD-10-CM | POA: Diagnosis not present

## 2014-11-15 DIAGNOSIS — I10 Essential (primary) hypertension: Secondary | ICD-10-CM | POA: Diagnosis not present

## 2014-11-15 LAB — LIPID PANEL
CHOLESTEROL: 236 mg/dL — AB (ref 0–200)
HDL: 53.1 mg/dL (ref >39.00)
LDL Cholesterol: 153 mg/dL — ABNORMAL HIGH (ref 0–99)
NonHDL: 183.19
TRIGLYCERIDES: 149 mg/dL (ref 0.0–149.0)
Total CHOL/HDL Ratio: 4
VLDL: 29.8 mg/dL (ref 0.0–40.0)

## 2014-11-15 LAB — BASIC METABOLIC PANEL
BUN: 11 mg/dL (ref 6–23)
CO2: 29 mEq/L (ref 19–32)
Calcium: 12.1 mg/dL — ABNORMAL HIGH (ref 8.4–10.5)
Chloride: 94 mEq/L — ABNORMAL LOW (ref 96–112)
Creatinine, Ser: 0.95 mg/dL (ref 0.40–1.20)
GFR: 72.81 mL/min (ref >60.00)
GLUCOSE: 83 mg/dL (ref 70–99)
Potassium: 4.6 mEq/L (ref 3.5–5.1)
SODIUM: 131 meq/L — AB (ref 135–145)

## 2014-11-15 LAB — HEPATIC FUNCTION PANEL
ALBUMIN: 4.2 g/dL (ref 3.5–5.2)
ALK PHOS: 59 U/L (ref 39–117)
ALT: 14 U/L (ref 0–35)
AST: 16 U/L (ref 0–37)
Bilirubin, Direct: 0.1 mg/dL (ref 0.0–0.3)
TOTAL PROTEIN: 7.5 g/dL (ref 6.0–8.3)
Total Bilirubin: 0.5 mg/dL (ref 0.2–1.2)

## 2014-11-15 LAB — CBC WITH DIFFERENTIAL/PLATELET
Basophils Absolute: 0 10*3/uL (ref 0.0–0.1)
Basophils Relative: 0.3 % (ref 0.0–3.0)
EOS PCT: 1.4 % (ref 0.0–5.0)
Eosinophils Absolute: 0.1 10*3/uL (ref 0.0–0.7)
HEMATOCRIT: 34.9 % — AB (ref 36.0–46.0)
HEMOGLOBIN: 11.3 g/dL — AB (ref 12.0–15.0)
LYMPHS ABS: 2.3 10*3/uL (ref 0.7–4.0)
LYMPHS PCT: 27.1 % (ref 12.0–46.0)
MCHC: 32.3 g/dL (ref 30.0–36.0)
MCV: 89.6 fl (ref 78.0–100.0)
MONOS PCT: 6.8 % (ref 3.0–12.0)
Monocytes Absolute: 0.6 10*3/uL (ref 0.1–1.0)
Neutro Abs: 5.5 10*3/uL (ref 1.4–7.7)
Neutrophils Relative %: 64.4 % (ref 43.0–77.0)
Platelets: 351 10*3/uL (ref 150.0–400.0)
RBC: 3.89 Mil/uL (ref 3.87–5.11)
RDW: 14.7 % (ref 11.5–15.5)
WBC: 8.5 10*3/uL (ref 4.0–10.5)

## 2014-11-15 LAB — TSH: TSH: 3.87 u[IU]/mL (ref 0.35–4.50)

## 2014-11-15 NOTE — Patient Instructions (Signed)
It was great seeing you again today!  I will follow up with you regarding your lab work.   Consider going to the pool for exercise.   Start taking 20 mg of Lisinopril and monitor your blood pressure, bring your log with you to your next appointment.   Use the Advair once a day to see how you respond to it.   Follow up in one week.

## 2014-11-15 NOTE — Progress Notes (Signed)
Pre visit review using our clinic review tool, if applicable. No additional management support is needed unless otherwise documented below in the visit note. 

## 2014-11-15 NOTE — Progress Notes (Addendum)
Subjective:  Patient presents today for their annual wellness visit.she has a  has a past medical history of Asthma; GERD (gastroesophageal reflux disease); Hyperlipidemia; Hypertension; Osteoporosis; COPD (chronic obstructive pulmonary disease) ([place]); Allergy; Anemia; Diverticulosis; and Tubular adenoma of colon (10/01).  Patient presents for yearly preventative medicine examination.  All immunizations and health maintenance protocols were reviewed with the patient and needed orders were placed.  Appropriate screening laboratory values were ordered for the patient including screening of hyperlipidemia, renal function and hepatic function.  Medication reconciliation,  past medical history, social history, problem list and allergies were reviewed in detail with the patient  Goals were established with regard to weight loss, exercise, and  diet in compliance with medications  End of life planning was discussed and information on living will and HCPOA given.   She has no complaints today.     Preventive Screening-Counseling & Management  Smoking Status: Former smoker [employer] Smoking status: No smokers in home  Risk Factors Regular exercise: Does not exercise on a regular basis Diet: Does not eat a regular diet Fall Risk: None   Cardiac risk factors:  advanced age (older than 55 for men, 46 for women)  Hyperlipidemia No diabetes.  Family History: Heart disease and diabetes  Depression Screen None. PHQ2 0   Activities of Daily Living Independent ADLs and IADLs   Hearing Difficulties: *patient declines  Cognitive Testing No reported trouble.   Normal 3 word recall  List the Names of Other Physician/Practitioners you currently use: - None other at this time  Immunization History  Administered Date(s) Administered  . Influenza Split 10/31/2011  . Influenza Whole 10/09/2002, 12/30/2008, 01/24/2010  . Influenza, High Dose Seasonal PF 11/01/2014  .  Influenza,inj,Quad PF,36+ Mos 01/19/2013  . Pneumococcal Conjugate-13 05/24/2014  . Pneumococcal Polysaccharide-23 10/09/2002, 12/30/2008  . Tdap 05/10/2011  . Zoster 01/24/2010   Required Immunizations needed today None  Screening tests- up to date There are no preventive care reminders to display for this patient.  ROS- Cerumen impaction in right ear.   The following were reviewed and entered/updated in epic: Past Medical History  Diagnosis Date  . Asthma   . GERD (gastroesophageal reflux disease)   . Hyperlipidemia   . Hypertension   . Osteoporosis   . COPD (chronic obstructive pulmonary disease) ([place])   . Allergy   . Anemia   . Diverticulosis   . Tubular adenoma of colon 10/01   Patient Active Problem List   Diagnosis Date Noted  . Osteoarthritis of left hip 11/20/2011  . COPD (chronic obstructive pulmonary disease) ([place]) 01/24/2010  . OSTEOPENIA 01/24/2010  . ANEMIA 01/20/2008  . ALLERGIC RHINITIS 04/28/2007  . HYPERLIPIDEMIA 08/21/2006  . Essential hypertension 08/21/2006  . COLONIC POLYPS, HX OF 08/21/2006   Past Surgical History  Procedure Laterality Date  . Cardiac catheterization  2001    She denies  . Total hip arthroplasty  11/20/2011    Procedure: TOTAL HIP ARTHROPLASTY;  Surgeon: Tobi Bastos, MD;  Location: [facility];  Service: Orthopedics;  Laterality: Left;    Family History  Problem Relation Age of Onset  . Heart disease Mother   . Diabetes Sister   . Asthma Daughter   . Asthma Son   . Colon cancer Neg Hx   . Esophageal cancer Neg Hx   . Rectal cancer Neg Hx   . Stomach cancer Neg Hx   . Pancreatic cancer Neg Hx     Medications- reviewed and updated Current Outpatient  Prescriptions  Medication Sig Dispense Refill  . albuterol (PROVENTIL) (2.5 MG/3ML) 0.083% nebulizer solution INHALE ONE VIAL VIA NEBULIZER EVERY SIX HOURS AS NEEDED for wheezing 150 mL 0  . Albuterol Sulfate (PROAIR RESPICLICK) 767 (90 BASE) MCG/ACT AEPB Inhale 108 mcg  into the lungs 3 (three) times daily as needed. 1 each 3  . aspirin 81 MG tablet Take 81 mg by mouth daily.    . Calcium-Vitamin D (CALTRATE 600 PLUS-VIT D PO) Take by mouth daily.    Mariane Baumgarten Calcium (STOOL SOFTENER PO) Take by mouth as needed.    . fluticasone (FLOVENT HFA) 44 MCG/ACT inhaler Inhale 1 puff into the lungs 2 (two) times daily. 10.6 g 11  . Fluticasone Furoate-Vilanterol (BREO ELLIPTA) 100-25 MCG/INH AEPB Inhale 100 mcg into the lungs daily. 28 each 0  . lisinopril (PRINIVIL,ZESTRIL) 10 MG tablet Take 1 tablet (10 mg total) by mouth daily. 90 tablet 1  . losartan-hydrochlorothiazide (HYZAAR) 100-25 MG per tablet TAKE ONE TABLET BY MOUTH EVERY MORNING. 90 tablet 0  . Multiple Vitamins-Minerals (CENTRUM SILVER ULTRA WOMENS PO) Take by mouth.     No current facility-administered medications for this visit.    Allergies-reviewed and updated No Known Allergies  Social History   Social History  . Marital Status: Single    Spouse Name: N/A  . Number of Children: N/A  . Years of Education: N/A   Social History Main Topics  . Smoking status: Former Smoker    Quit date: 05/09/2000  . Smokeless tobacco: Never Used  . Alcohol Use: 0.6 oz/week    1 Glasses of wine per week     Comment: occ.  . Drug Use: No  . Sexual Activity: Not Currently   Other Topics Concern  . None   Social History Narrative   -Patient has never been married.    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    - Likes to read and watch TV.            Objective: BP 160/80 mmHg  Temp(Src) 98.3 F (36.8 C) (Oral)  Ht 5\' 4"  (1.626 m)  Wt 197 lb 1.6 oz (89.404 kg)  BMI 33.82 kg/m2 GENERAL: vitals reviewed and listed above, alert, oriented, appears well hydrated and in no acute distress. Obese  HEENT: atraumatic, conjunttiva clear, no obvious abnormalities on inspection of external nose and ears. Moderate cerumen noted in  bilateral ear canals.   NECK: no obvious masses on inspection. Thyroid not enlarged.   LUNGS: wheezing heard in all lobes.No rales or rhonchi, decent air movement. Audible wheezing.   CV: HRRR, no peripheral edema. No carotid bruit.   MS: moves all extremities without noticeable abnormality. No edema noted  Breast Exam: No lumps, masses, dimpling or discharge noted.   Abd: soft/nontender/nondistended/normal bowel sounds. Obese around abdomen.   Skin: warm and dry, no rash   Neuro: CN II-XII intact, sensation and reflexes normal throughout, 5/5 muscle strength in bilateral upper and lower extremities. Normal finger to nose. Normal rapid alternating movements. Normal romberg. No pronator drift.   PSYCH: pleasant and cooperative, no obvious depression or anxiety  Assessment/Plan:  1. Essential hypertension - Hypertensive in office today at 160/80.  - Take 20 mg Lisinopril for the next week and keep log. Follow up in one week.  - Basic metabolic panel - CBC with Differential/Platelet -  Hepatic function panel - She needs to change her diet and stop eating fast food and processed foods.  - Lipid panel - TSH  2. Hyperlipidemia - Basic metabolic panel - CBC with Differential/Platelet - Hepatic function panel - Lipid panel - TSH  3. Medicare annual wellness visit, subsequent - Follow up in one year - Follow up sooner if needed - Cerumen impaction in right ear removed by irrigation.    Return precautions advised.

## 2014-11-19 DIAGNOSIS — R42 Dizziness and giddiness: Secondary | ICD-10-CM | POA: Diagnosis not present

## 2014-11-19 DIAGNOSIS — Z79899 Other long term (current) drug therapy: Secondary | ICD-10-CM | POA: Diagnosis not present

## 2014-11-19 DIAGNOSIS — Z87891 Personal history of nicotine dependence: Secondary | ICD-10-CM | POA: Diagnosis not present

## 2014-11-19 DIAGNOSIS — Z7982 Long term (current) use of aspirin: Secondary | ICD-10-CM | POA: Diagnosis not present

## 2014-11-19 DIAGNOSIS — Z7951 Long term (current) use of inhaled steroids: Secondary | ICD-10-CM | POA: Diagnosis not present

## 2014-11-19 DIAGNOSIS — J45909 Unspecified asthma, uncomplicated: Secondary | ICD-10-CM | POA: Diagnosis not present

## 2014-11-19 DIAGNOSIS — E871 Hypo-osmolality and hyponatremia: Secondary | ICD-10-CM | POA: Diagnosis not present

## 2014-11-19 DIAGNOSIS — I10 Essential (primary) hypertension: Secondary | ICD-10-CM | POA: Diagnosis not present

## 2014-11-22 ENCOUNTER — Encounter: Payer: Self-pay | Admitting: Cardiology

## 2014-11-22 ENCOUNTER — Ambulatory Visit (INDEPENDENT_AMBULATORY_CARE_PROVIDER_SITE_OTHER): Payer: Medicare Other | Admitting: Adult Health

## 2014-11-22 ENCOUNTER — Encounter: Payer: Self-pay | Admitting: Pulmonary Disease

## 2014-11-22 ENCOUNTER — Ambulatory Visit (INDEPENDENT_AMBULATORY_CARE_PROVIDER_SITE_OTHER): Payer: Medicare Other | Admitting: Pulmonary Disease

## 2014-11-22 ENCOUNTER — Encounter: Payer: Self-pay | Admitting: Adult Health

## 2014-11-22 VITALS — BP 138/72 | HR 68 | Ht 65.0 in | Wt 193.2 lb

## 2014-11-22 VITALS — BP 120/70 | HR 88 | Temp 97.5°F | Wt 193.6 lb

## 2014-11-22 DIAGNOSIS — I10 Essential (primary) hypertension: Secondary | ICD-10-CM

## 2014-11-22 DIAGNOSIS — J4541 Moderate persistent asthma with (acute) exacerbation: Secondary | ICD-10-CM

## 2014-11-22 DIAGNOSIS — E785 Hyperlipidemia, unspecified: Secondary | ICD-10-CM | POA: Diagnosis not present

## 2014-11-22 DIAGNOSIS — J439 Emphysema, unspecified: Secondary | ICD-10-CM

## 2014-11-22 MED ORDER — ALBUTEROL SULFATE 108 (90 BASE) MCG/ACT IN AEPB: 108.0000 ug | INHALATION_SPRAY | Freq: Three times a day (TID) | RESPIRATORY_TRACT | Status: DC | PRN

## 2014-11-22 MED ORDER — LOSARTAN POTASSIUM-HCTZ 100-25 MG PO TABS: 1.0000 | ORAL_TABLET | Freq: Every morning | ORAL | Status: DC

## 2014-11-22 MED ORDER — ATORVASTATIN CALCIUM 10 MG PO TABS: 10.0000 mg | ORAL_TABLET | Freq: Every day | ORAL | Status: DC

## 2014-11-22 NOTE — Patient Instructions (Signed)
We will schedule you for lung function tests. Continue using the albuterol nebulizer, inhaler as needed for dyspnea, wheezing. Stop using the Flovent. We started you on Advair, take 2 puffs in the morning and 2 puffs in the evening.  Return to clinic in 2 months.

## 2014-11-22 NOTE — Progress Notes (Signed)
Pre visit review using our clinic review tool, if applicable. No additional management support is needed unless otherwise documented below in the visit note. 

## 2014-11-22 NOTE — Patient Instructions (Signed)
It was great seeing you again.   Take the lisinopril in the evening   Monitor your blood pressure in the morning and at night... Write them in a log.   Follow up with me in two weeks.

## 2014-11-22 NOTE — Progress Notes (Signed)
Subjective:    Patient ID: Heidi Hoffman, female    DOB: 1932/07/18, 81 y.o.   MRN: [id number]  HPI  81 year old female who presents to the office today for one week follow up regarding her blood pressure. She endorses that it continues to be up and down. She did not bring her log with her. Today in the office her BP was 120/70. She was seen in the ER three days ago for dizziness and there BP at that time was 179/75. She denies having dizziness at this time. Slight frontal headache. No blurred vision.   She continues to eat foods high in sodium but is going to work on her diet.    Review of Systems  Constitutional: Negative.   Respiratory: Negative.   Cardiovascular: Negative.   Neurological: Positive for headaches.  All other systems reviewed and are negative.  Past Medical History  Diagnosis Date  . Asthma   . GERD (gastroesophageal reflux disease)   . Hyperlipidemia   . Hypertension   . Osteoporosis   . COPD (chronic obstructive pulmonary disease) ([place])   . Allergy   . Anemia   . Diverticulosis   . Tubular adenoma of colon 10/01    Social History   Social History  . Marital Status: Single    Spouse Name: N/A  . Number of Children: N/A  . Years of Education: N/A   Occupational History  . Not on file.   Social History Main Topics  . Smoking status: Former Smoker    Quit date: 05/09/2000  . Smokeless tobacco: Never Used  . Alcohol Use: 0.6 oz/week    1 Glasses of wine per week     Comment: occ.  . Drug Use: No  . Sexual Activity: Not Currently   Other Topics Concern  . Not on file   Social History Narrative   -Patient has never been married.    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    - Likes to read and watch TV.            Past Surgical History  Procedure Laterality Date  . Cardiac catheterization  2001    She denies  . Total hip arthroplasty  11/20/2011   Procedure: TOTAL HIP ARTHROPLASTY;  Surgeon: Tobi Bastos, MD;  Location: [facility];  Service: Orthopedics;  Laterality: Left;    Family History  Problem Relation Age of Onset  . Heart disease Mother   . Diabetes Sister   . Asthma Daughter   . Asthma Son   . Colon cancer Neg Hx   . Esophageal cancer Neg Hx   . Rectal cancer Neg Hx   . Stomach cancer Neg Hx   . Pancreatic cancer Neg Hx     No Known Allergies  Current Outpatient Prescriptions on File Prior to Visit  Medication Sig Dispense Refill  . albuterol (PROVENTIL) (2.5 MG/3ML) 0.083% nebulizer solution INHALE ONE VIAL VIA NEBULIZER EVERY SIX HOURS AS NEEDED for wheezing 150 mL 0  . Albuterol Sulfate (PROAIR RESPICLICK) 123XX123 (90 BASE) MCG/ACT AEPB Inhale 108 mcg into the lungs 3 (three) times daily as needed. 1 each 3  . aspirin 81 MG tablet Take 81 mg by mouth daily.    . Calcium-Vitamin D (CALTRATE 600 PLUS-VIT D PO) Take by mouth daily.    . Docusate Calcium (STOOL  SOFTENER PO) Take by mouth as needed.    . fluticasone (FLOVENT HFA) 44 MCG/ACT inhaler Inhale 1 puff into the lungs 2 (two) times daily. 10.6 g 11  . Fluticasone Furoate-Vilanterol (BREO ELLIPTA) 100-25 MCG/INH AEPB Inhale 100 mcg into the lungs daily. 28 each 0  . lisinopril (PRINIVIL,ZESTRIL) 10 MG tablet Take 1 tablet (10 mg total) by mouth daily. 90 tablet 1  . Multiple Vitamins-Minerals (CENTRUM SILVER ULTRA WOMENS PO) Take by mouth.     No current facility-administered medications on file prior to visit.    BP 120/70 mmHg  Pulse 88  Temp(Src) 97.5 F (36.4 C) (Oral)  Wt 193 lb 9.6 oz (87.816 kg)       Objective:   Physical Exam  Constitutional: She is oriented to person, place, and time. She appears well-developed and well-nourished. No distress.  Eyes: Conjunctivae are normal. Pupils are equal, round, and reactive to light. Right eye exhibits no discharge.  Cardiovascular: Normal rate, regular rhythm, normal heart sounds and intact distal  pulses.  Exam reveals no gallop and no friction rub.   No murmur heard. Pulmonary/Chest: Effort normal and breath sounds normal. No respiratory distress. She has no wheezes. She has no rales. She exhibits no tenderness.  Neurological: She is alert and oriented to person, place, and time.  Skin: Skin is warm and dry. No rash noted. She is not diaphoretic. No erythema. No pallor.  Psychiatric: She has a normal mood and affect. Her behavior is normal. Thought content normal.  Nursing note and vitals reviewed.      Assessment & Plan:  1. Essential hypertension - Take prescribed lisinopril at night - Monitor your BP twice a day and write in log. Bring log to next visit in two weeks - Let me know if your BP continues to be 150 or above.  - Start eating healthier.  2. Hyperlipidemia - atorvastatin (LIPITOR) 10 MG tablet; Take 1 tablet (10 mg total) by mouth daily.  Dispense: 90 tablet; Refill: 3 - Stop with muscle aches

## 2014-11-22 NOTE — Progress Notes (Signed)
Subjective:    Patient ID: Heidi Hoffman, female    DOB: 1932/10/18, 81 y.o.   MRN: [id number]  HPI Consult for management of COPD, asthma.  Heidi Hoffman is a 81 year old with heavy smoking history in the past. She is diagnosed with COPD and asthma. She had PFTs many years ago but does not recall what they showed. I do not have this in the chart to review. She is also diagnosed with asthma since the age of 4. Her chief complaints are dyspnea on exertion, wheezing, occasional cough with white mucus production. She says that the change in weather to hot humid summer days worsens her symptoms.  She is on albuterol nebulizer which helps with her symptoms. She is also taking Flovent. She was given Devota Pace but has stopped taking this as it makes her mouth dry. She was on Advair at some point in the past but does not recall if it helped with her symptoms. She smoked 1 pack per day for 50 years. She quit in the year 2000.  Chest x-ray [11/14/11] No acute cardiopulmonary disease.  Past Medical History  Diagnosis Date  . Asthma   . GERD (gastroesophageal reflux disease)   . Hyperlipidemia   . Hypertension   . Osteoporosis   . COPD (chronic obstructive pulmonary disease) ([place])   . Allergy   . Anemia   . Diverticulosis   . Tubular adenoma of colon 10/01    Current outpatient prescriptions:  .  albuterol (PROVENTIL) (2.5 MG/3ML) 0.083% nebulizer solution, INHALE ONE VIAL VIA NEBULIZER EVERY SIX HOURS AS NEEDED for wheezing, Disp: 150 mL, Rfl: 0 .  Albuterol Sulfate (PROAIR RESPICLICK) 123XX123 (90 BASE) MCG/ACT AEPB, Inhale 108 mcg into the lungs 3 (three) times daily as needed., Disp: 1 each, Rfl: 3 .  aspirin 81 MG tablet, Take 81 mg by mouth daily., Disp: , Rfl:  .  atorvastatin (LIPITOR) 10 MG tablet, Take 1 tablet (10 mg total) by mouth daily., Disp: 90 tablet, Rfl: 3 .  Calcium-Vitamin D (CALTRATE 600 PLUS-VIT D PO), Take by mouth daily., Disp: , Rfl:  .  Docusate Calcium (STOOL SOFTENER  PO), Take by mouth as needed., Disp: , Rfl:  .  fluticasone (FLOVENT HFA) 44 MCG/ACT inhaler, Inhale 1 puff into the lungs 2 (two) times daily., Disp: 10.6 g, Rfl: 11 .  lisinopril (PRINIVIL,ZESTRIL) 10 MG tablet, Take 1 tablet (10 mg total) by mouth daily., Disp: 90 tablet, Rfl: 1 .  losartan-hydrochlorothiazide (HYZAAR) 100-25 MG tablet, Take 1 tablet by mouth every morning., Disp: 90 tablet, Rfl: 4 .  Multiple Vitamins-Minerals (CENTRUM SILVER ULTRA WOMENS PO), Take by mouth., Disp: , Rfl:  .  Fluticasone Furoate-Vilanterol (BREO ELLIPTA) 100-25 MCG/INH AEPB, Inhale 100 mcg into the lungs daily. (Patient not taking: Reported on 11/22/2014), Disp: 28 each, Rfl: 0  Review of Systems Dyspnea on exertion, wheeze. Occasional cough with white mucus production. Denies any hemoptysis, fevers, chills. Denies any chest pain, palpitations. Denies any nausea, vomiting, diarrhea, constipation. All other review of systems are negative    Objective:   Physical Exam Blood pressure 138/72, pulse 68, height 5\' 5"  (1.651 m), weight 193 lb 3.2 oz (87.635 kg), SpO2 98 %.  Gen:  No apparent distress Neuro: No gross focal deficits. Neck: No JVD, lymphadenopathy, thyromegaly. RS: Mild basilar crackles. No wheeze. Nonlabored breathing. CVS: S1-S2 heard, no murmurs rubs gallops. Abdomen: Soft, positive bowel sounds. Extremities: No edema.    Assessment & Plan:  COPD, asthma.  She will continue  her albuterol as needed. She is requesting an albuterol inhaler to use along with the nebulizers. I'll also start her on Advair 250/50. She will stop taking the Flovent and is already off the [employer]. She also get scheduled for lung function tests to get an idea of the degree of obstruction.  Return to clinic in 1-2 months to review results of tests and response to therapy.  Marshell Garfinkel MD [facility] Pager [phone] If no answer or after 3pm call: [phone] 11/22/2014, 3:27  PM

## 2014-11-22 NOTE — Addendum Note (Signed)
Addended by: Parke Poisson E on: 11/22/2014 04:52 PM   Modules accepted: Orders, Medications

## 2014-11-23 ENCOUNTER — Telehealth: Payer: Self-pay | Admitting: Pulmonary Disease

## 2014-11-23 NOTE — Telephone Encounter (Signed)
Spoke with pt, verified that the advair she has at home is the same strength PM wanted her to start on yesterday.  Nothing further needed.

## 2014-12-06 ENCOUNTER — Encounter: Payer: Self-pay | Admitting: Adult Health

## 2014-12-06 ENCOUNTER — Ambulatory Visit (INDEPENDENT_AMBULATORY_CARE_PROVIDER_SITE_OTHER): Payer: Medicare Other | Admitting: Adult Health

## 2014-12-06 VITALS — BP 134/80 | Temp 98.5°F | Ht 65.0 in | Wt 193.9 lb

## 2014-12-06 DIAGNOSIS — I10 Essential (primary) hypertension: Secondary | ICD-10-CM

## 2014-12-06 NOTE — Progress Notes (Signed)
Subjective:    Patient ID: Heidi Hoffman, female    DOB: 28-May-1932, 81 y.o.   MRN: [id number]  HPI  82 year old female who presents to the office today for 2 week follow up regarding her BP. The last time I saw here, I had her take prescribed Lisinopril in the evening.  She did not bring her log with her. Endorses that her blood pressures have been more controlled, she is not feeling dizzy anymore and over all " I feel so much better."   She has been cook and eating healthier.    Review of Systems  Constitutional: Negative.   Respiratory: Negative.   Cardiovascular: Negative.   Gastrointestinal: Negative.   Neurological: Negative.   All other systems reviewed and are negative.  Past Medical History  Diagnosis Date  . Asthma   . GERD (gastroesophageal reflux disease)   . Hyperlipidemia   . Hypertension   . Osteoporosis   . COPD (chronic obstructive pulmonary disease) ([place])   . Allergy   . Anemia   . Diverticulosis   . Tubular adenoma of colon 10/01    Social History   Social History  . Marital Status: Single    Spouse Name: N/A  . Number of Children: N/A  . Years of Education: N/A   Occupational History  . Not on file.   Social History Main Topics  . Smoking status: Former Smoker -- 2.00 packs/day for 50 years    Types: Cigarettes    Quit date: 05/09/2000  . Smokeless tobacco: Never Used  . Alcohol Use: 0.6 oz/week    1 Glasses of wine per week     Comment: occ.  . Drug Use: No  . Sexual Activity: Not Currently   Other Topics Concern  . Not on file   Social History Narrative   -Patient has never been married.    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    - Likes to read and watch TV.            Past Surgical History  Procedure Laterality Date  . Cardiac catheterization  2001    She denies  . Total hip arthroplasty  11/20/2011    Procedure: TOTAL HIP ARTHROPLASTY;   Surgeon: Tobi Bastos, MD;  Location: [facility];  Service: Orthopedics;  Laterality: Left;    Family History  Problem Relation Age of Onset  . Heart disease Mother   . Diabetes Sister   . Asthma Daughter   . Asthma Son   . Colon cancer Neg Hx   . Esophageal cancer Neg Hx   . Rectal cancer Neg Hx   . Stomach cancer Neg Hx   . Pancreatic cancer Neg Hx     No Known Allergies  Current Outpatient Prescriptions on File Prior to Visit  Medication Sig Dispense Refill  . albuterol (PROVENTIL) (2.5 MG/3ML) 0.083% nebulizer solution INHALE ONE VIAL VIA NEBULIZER EVERY SIX HOURS AS NEEDED for wheezing 150 mL 0  . Albuterol Sulfate (PROAIR RESPICLICK) 123XX123 (90 BASE) MCG/ACT AEPB Inhale 108 mcg into the lungs 3 (three) times daily as needed. 1 each 5  . aspirin 81 MG tablet Take 81 mg by mouth daily.    Marland Kitchen atorvastatin (LIPITOR) 10 MG tablet Take 1 tablet (10 mg total) by mouth daily. 90 tablet 3  . Calcium-Vitamin D (CALTRATE  600 PLUS-VIT D PO) Take by mouth daily.    Mariane Baumgarten Calcium (STOOL SOFTENER PO) Take by mouth as needed.    Marland Kitchen lisinopril (PRINIVIL,ZESTRIL) 10 MG tablet Take 1 tablet (10 mg total) by mouth daily. 90 tablet 1  . losartan-hydrochlorothiazide (HYZAAR) 100-25 MG tablet Take 1 tablet by mouth every morning. 90 tablet 4  . Multiple Vitamins-Minerals (CENTRUM SILVER ULTRA WOMENS PO) Take by mouth.     No current facility-administered medications on file prior to visit.    BP 134/80 mmHg  Temp(Src) 98.5 F (36.9 C) (Oral)  Ht 5\' 5"  (1.651 m)  Wt 193 lb 14.4 oz (87.952 kg)  BMI 32.27 kg/m2       Objective:   Physical Exam  Constitutional: She is oriented to person, place, and time. She appears well-developed and well-nourished. No distress.  Cardiovascular: Normal rate, regular rhythm, normal heart sounds and intact distal pulses.  Exam reveals no gallop and no friction rub.   No murmur heard. Pulmonary/Chest: Effort normal and breath sounds normal. No respiratory  distress. She has no wheezes. She has no rales. She exhibits no tenderness.  Neurological: She is alert and oriented to person, place, and time.  Skin: Skin is warm and dry. No rash noted. She is not diaphoretic. No erythema. No pallor.  Psychiatric: She has a normal mood and affect. Her behavior is normal. Judgment and thought content normal.  Nursing note and vitals reviewed.      Assessment & Plan:  1. Essential hypertension - BP today BP: 134/80 mmHg, almost at goal.  - Continue with current therapy since she is feeling better.  - Continue to work on diet and exercise. - Return parameters given

## 2014-12-06 NOTE — Patient Instructions (Signed)
I am so happy you are feeling better.   Continue to take your medication as directed. Monitor your blood pressure and let me know if it is going up.   Enjoy the [place] and safe travels up to [place].

## 2015-01-11 DIAGNOSIS — Z139 Encounter for screening, unspecified: Secondary | ICD-10-CM | POA: Diagnosis not present

## 2015-01-11 DIAGNOSIS — R42 Dizziness and giddiness: Secondary | ICD-10-CM | POA: Diagnosis not present

## 2015-01-11 DIAGNOSIS — J4521 Mild intermittent asthma with (acute) exacerbation: Secondary | ICD-10-CM | POA: Diagnosis not present

## 2015-01-11 DIAGNOSIS — E785 Hyperlipidemia, unspecified: Secondary | ICD-10-CM | POA: Diagnosis not present

## 2015-01-11 DIAGNOSIS — I10 Essential (primary) hypertension: Secondary | ICD-10-CM | POA: Diagnosis not present

## 2015-01-12 DIAGNOSIS — E785 Hyperlipidemia, unspecified: Secondary | ICD-10-CM | POA: Diagnosis not present

## 2015-01-12 DIAGNOSIS — R42 Dizziness and giddiness: Secondary | ICD-10-CM | POA: Diagnosis not present

## 2015-01-12 DIAGNOSIS — I10 Essential (primary) hypertension: Secondary | ICD-10-CM | POA: Diagnosis not present

## 2015-01-21 ENCOUNTER — Other Ambulatory Visit: Payer: Self-pay | Admitting: Pulmonary Disease

## 2015-01-21 DIAGNOSIS — R0600 Dyspnea, unspecified: Secondary | ICD-10-CM

## 2015-01-24 ENCOUNTER — Ambulatory Visit: Payer: Medicare Other | Admitting: Pulmonary Disease

## 2015-01-24 ENCOUNTER — Ambulatory Visit (INDEPENDENT_AMBULATORY_CARE_PROVIDER_SITE_OTHER): Payer: Medicare Other | Admitting: Pulmonary Disease

## 2015-01-24 DIAGNOSIS — R0600 Dyspnea, unspecified: Secondary | ICD-10-CM

## 2015-01-24 LAB — PULMONARY FUNCTION TEST
DL/VA % PRED: 66 %
DL/VA: 3.22 ml/min/mmHg/L
DLCO UNC: 10.5 ml/min/mmHg
DLCO unc % pred: 43 %
FEF 25-75 POST: 1.54 L/s
FEF 25-75 Pre: 0.72 L/sec
FEF2575-%Change-Post: 112 %
FEF2575-%PRED-POST: 117 %
FEF2575-%PRED-PRE: 55 %
FEV1-%Change-Post: 24 %
FEV1-%PRED-PRE: 60 %
FEV1-%Pred-Post: 75 %
FEV1-POST: 1.18 L
FEV1-PRE: 0.95 L
FEV1FVC-%CHANGE-POST: -8 %
FEV1FVC-%PRED-PRE: 102 %
FEV6-%CHANGE-POST: 36 %
FEV6-%PRED-PRE: 63 %
FEV6-%Pred-Post: 86 %
FEV6-Post: 1.68 L
FEV6-Pre: 1.23 L
FEV6FVC-%Pred-Post: 104 %
FEV6FVC-%Pred-Pre: 104 %
FVC-%Change-Post: 36 %
FVC-%PRED-POST: 83 %
FVC-%PRED-PRE: 60 %
FVC-PRE: 1.23 L
FVC-Post: 1.68 L
POST FEV6/FVC RATIO: 100 %
PRE FEV1/FVC RATIO: 77 %
Post FEV1/FVC ratio: 70 %
Pre FEV6/FVC Ratio: 100 %

## 2015-01-24 NOTE — Progress Notes (Signed)
PFT done today. 

## 2015-02-21 ENCOUNTER — Ambulatory Visit (INDEPENDENT_AMBULATORY_CARE_PROVIDER_SITE_OTHER): Payer: Medicare Other | Admitting: Pulmonary Disease

## 2015-02-21 ENCOUNTER — Encounter: Payer: Self-pay | Admitting: Pulmonary Disease

## 2015-02-21 VITALS — BP 132/78 | HR 64 | Ht 65.0 in | Wt 189.4 lb

## 2015-02-21 DIAGNOSIS — I208 Other forms of angina pectoris: Secondary | ICD-10-CM | POA: Diagnosis not present

## 2015-02-21 DIAGNOSIS — J449 Chronic obstructive pulmonary disease, unspecified: Secondary | ICD-10-CM | POA: Diagnosis not present

## 2015-02-21 DIAGNOSIS — J849 Interstitial pulmonary disease, unspecified: Secondary | ICD-10-CM

## 2015-02-21 NOTE — Patient Instructions (Addendum)
Please take your Advair twice a day every day. Continue to use your albuterol nebulizer machine or inhaler as needed for shortness of breath or wheezing. We will schedule you for a CT scan of your chest to better look at your lungs. We will refer you to a heart doctor to see you about your chest pain that occurs with walking. Follow up with Dr. Vaughan Browner in 3 months, or sooner if needed.

## 2015-02-21 NOTE — Progress Notes (Signed)
Subjective:    Patient ID: Heidi Hoffman, female    DOB: 1932/05/31, 82 y.o.   MRN: [id number]  HPI Consult for management of COPD, asthma.  Heidi Hoffman is a 82 year old with heavy smoking history in the past (stopped 15 years ago), COPD and asthma. Her chief complaints are dyspnea on exertion, wheezing, occasional cough with white mucus production.   She reports she had an upper respiratory infection in December which she is recovering from. She has occasional wheezing. She uses her albuterol as needed and uses it 4 days a week. She has Advair and has been using it as needed. Last used last week.She has a cough productive of white sputum. Denies fevers, chills, dyspnea. She has exertional dyspnea and is able to walk around the grocery store without problem.   She does not have chest pain presently. She has exertional chest pain. Feels like pressure mid sternum. Relieves with rest. Occurs only with ambulating. Example is when she walks to take out her trash. No chest pain at rest. No chest pain at night or with lying down. She has occasional reflux which she controls with ginger ale. Denies personal history of heart problems. Family history of heart problems in mother.  PFTs [02/21/2015] FEV1/FVC 77%, FEV1 0.95L 60%, post bronchodilator 1.18L +24%, FVC 1.23L 60%, post bronchodilator 1.68L +36%, FEF 25-75% 55%, TLC 67%, RV/TLC 124%, DLCOunc 43%, DL/VA 66%  Chest x-ray [11/14/2011] No acute cardiopulmonary disease. CT chest w/o contrast [06/26/2006] No acute findings. Scar in right middle lobe. Nonspecific tiny nodules in lung apices.   Past Medical History  Diagnosis Date  . Asthma   . GERD (gastroesophageal reflux disease)   . Hyperlipidemia   . Hypertension   . Osteoporosis   . COPD (chronic obstructive pulmonary disease) ([place])   . Allergy   . Anemia   . Diverticulosis   . Tubular adenoma of colon 10/01    Current outpatient prescriptions:  .  albuterol (PROVENTIL) (2.5 MG/3ML)  0.083% nebulizer solution, INHALE ONE VIAL VIA NEBULIZER EVERY SIX HOURS AS NEEDED for wheezing, Disp: 150 mL, Rfl: 0 .  Albuterol Sulfate (PROAIR RESPICLICK) 123XX123 (90 BASE) MCG/ACT AEPB, Inhale 108 mcg into the lungs 3 (three) times daily as needed., Disp: 1 each, Rfl: 5 .  aspirin 81 MG tablet, Take 81 mg by mouth daily., Disp: , Rfl:  .  atorvastatin (LIPITOR) 10 MG tablet, Take 1 tablet (10 mg total) by mouth daily., Disp: 90 tablet, Rfl: 3 .  Calcium-Vitamin D (CALTRATE 600 PLUS-VIT D PO), Take by mouth daily., Disp: , Rfl:  .  Docusate Calcium (STOOL SOFTENER PO), Take by mouth as needed., Disp: , Rfl:  .  lisinopril (PRINIVIL,ZESTRIL) 10 MG tablet, Take 1 tablet (10 mg total) by mouth daily., Disp: 90 tablet, Rfl: 1 .  losartan-hydrochlorothiazide (HYZAAR) 100-25 MG tablet, Take 1 tablet by mouth every morning., Disp: 90 tablet, Rfl: 4 .  Multiple Vitamins-Minerals (CENTRUM SILVER ULTRA WOMENS PO), Take by mouth., Disp: , Rfl:   Review of Systems Dyspnea on exertion, wheeze. Occasional cough with white mucus production. Denies any hemoptysis, fevers, chills. Denies any palpitations. Denies any nausea, vomiting, diarrhea, constipation. All other review of systems are negative    Objective:   Physical Exam Today's Vitals   02/21/15 1343  BP: 132/78  Pulse: 64  Height: 5\' 5"  (1.651 m)  Weight: 189 lb 6.4 oz (85.911 kg)  SpO2: 97%   Gen:  No apparent distress Neuro: No gross focal deficits. Neck: No  JVD, lymphadenopathy, thyromegaly. RS: No wheeze. Nonlabored breathing. CVS: S1-S2 heard, no murmurs rubs gallops. Abdomen: Soft, positive bowel sounds. Extremities: No edema.    Assessment & Plan:  COPD, asthma FEV1 60. She has bronchodilator response. Uses albuterol 4 days a week. No wheezes on exam today.  Continue albuterol as needed.  Start Advair 250/50 twice a day. Delsym as needed for cough.  Interstitial Lung Disease Evidence of restriction on PFTs and decreased  diffusion capacity.  High res CT chest  Exertional Angina Exertional chest pressure. No nocturnal symptoms. She has risk factors for cardiovascular disease.  Referral to cardiology.  Return to clinic in 3 months to review results of tests and response to therapy.  Jacques Earthly, MD  Internal Medicine PGY-2  02/21/2015, 1:38 PM   Attending note: I have seen and examined the patient with nurse practitioner/resident and agree with the note. History, labs and imaging reviewed.  Heidi Hoffman is doing well but is only using the Advair PRN. Since there is a bronchodilator response on PFTs she will benefit from optimization of inhalers. We have asked her to take advair regularly twice daily. She has restriction and DLCO impairment on PFTs of unclear etiology. She will be scheduled for a CT of chest to evaluate.  Cardiology referral for evaluation of exertional chest pain.  Marshell Garfinkel MD [facility] Pager [phone] If no answer or after 3pm call: [phone] 02/21/2015, 3:01 PM

## 2015-02-22 ENCOUNTER — Telehealth: Payer: Self-pay

## 2015-02-22 NOTE — Telephone Encounter (Signed)
Received a vm from pt's daughter stating pt does not remember getting the results of her lab work from her physical in November 2016.  Please advise.

## 2015-02-24 ENCOUNTER — Other Ambulatory Visit: Payer: Self-pay | Admitting: Adult Health

## 2015-02-24 NOTE — Telephone Encounter (Signed)
Advised pt's daughter of the lab results.  Per pt's daughter pt had to be seen in [place] on Jan 5th because she was lightheaded and dizzy.  Pt's labs came back and pt's creatinine was 1.18, eGFR 44, GFR mdrd 50 and calcium 11.8.  Pt was told her kidneys were not functioning properly and that her calcium was too high. At that time pt was taking a calcium pill and she stopped it 3 weeks ago.

## 2015-02-24 NOTE — Telephone Encounter (Signed)
Labs look good for the most part. Her cholesterol is elevated, simple changes in diet can help with this.   I would like her to come back and have her calcium rechecked. I want her to be very well hydrated for this redraw.   * Labs are in

## 2015-03-07 ENCOUNTER — Encounter: Payer: Self-pay | Admitting: Cardiology

## 2015-03-07 ENCOUNTER — Ambulatory Visit (INDEPENDENT_AMBULATORY_CARE_PROVIDER_SITE_OTHER)
Admission: RE | Admit: 2015-03-07 | Discharge: 2015-03-07 | Disposition: A | Discharge status: Home / Self Care | Payer: Medicare Other | Source: Ambulatory Visit | Attending: Pulmonary Disease | Admitting: Pulmonary Disease

## 2015-03-07 ENCOUNTER — Other Ambulatory Visit (INDEPENDENT_AMBULATORY_CARE_PROVIDER_SITE_OTHER): Payer: Medicare Other

## 2015-03-07 ENCOUNTER — Ambulatory Visit (INDEPENDENT_AMBULATORY_CARE_PROVIDER_SITE_OTHER): Payer: Medicare Other | Admitting: Cardiology

## 2015-03-07 VITALS — BP 148/80 | HR 98 | Ht 65.0 in | Wt 187.8 lb

## 2015-03-07 DIAGNOSIS — R079 Chest pain, unspecified: Secondary | ICD-10-CM | POA: Insufficient documentation

## 2015-03-07 DIAGNOSIS — E785 Hyperlipidemia, unspecified: Secondary | ICD-10-CM | POA: Diagnosis not present

## 2015-03-07 DIAGNOSIS — R0602 Shortness of breath: Secondary | ICD-10-CM | POA: Diagnosis not present

## 2015-03-07 DIAGNOSIS — J849 Interstitial pulmonary disease, unspecified: Secondary | ICD-10-CM | POA: Diagnosis not present

## 2015-03-07 DIAGNOSIS — I10 Essential (primary) hypertension: Secondary | ICD-10-CM | POA: Diagnosis not present

## 2015-03-07 DIAGNOSIS — J45909 Unspecified asthma, uncomplicated: Secondary | ICD-10-CM | POA: Diagnosis not present

## 2015-03-07 LAB — POCT URINALYSIS DIPSTICK
Bilirubin, UA: NEGATIVE
Glucose, UA: NEGATIVE
KETONES UA: NEGATIVE
NITRITE UA: NEGATIVE
PH UA: 7
PROTEIN UA: NEGATIVE
RBC UA: NEGATIVE
Spec Grav, UA: 1.01
Urobilinogen, UA: 0.2

## 2015-03-07 LAB — BASIC METABOLIC PANEL
BUN: 12 mg/dL (ref 6–23)
CO2: 28 mEq/L (ref 19–32)
Calcium: 11.7 mg/dL — ABNORMAL HIGH (ref 8.4–10.5)
Chloride: 100 mEq/L (ref 96–112)
Creatinine, Ser: 1.14 mg/dL (ref 0.40–1.20)
GFR: 58.95 mL/min — AB (ref >60.00)
Glucose, Bld: 84 mg/dL (ref 70–99)
POTASSIUM: 4.3 meq/L (ref 3.5–5.1)
SODIUM: 135 meq/L (ref 135–145)

## 2015-03-07 NOTE — Patient Instructions (Signed)
Medication Instructions:  Your physician recommends that you continue on your current medications as directed. Please refer to the Current Medication list given to you today.   Labwork: None  Testing/Procedures: Your physician has requested that you have an echocardiogram. Echocardiography is a painless test that uses sound waves to create images of your heart. It provides your doctor with information about the size and shape of your heart and how well your heart's chambers and valves are working. This procedure takes approximately one hour. There are no restrictions for this procedure.   Dr. Radford Pax recommends you have a NUCLEAR STRESS TEST.  Follow-Up: Your physician recommends that you schedule a follow-up appointment AS NEEDED with Dr. Radford Pax pending your test results.  Any Other Special Instructions Will Be Listed Below (If Applicable).     If you need a refill on your cardiac medications before your next appointment, please call your pharmacy.

## 2015-03-07 NOTE — Progress Notes (Signed)
Cardiology Office Note   Date:  03/07/2015   ID:  Heidi Hoffman, DOB 12/01/1932, MRN [id number]  PCP:  Dorothyann Peng, NP    Chief Complaint  Patient presents with  . Chest Pain      History of Present Illness: Heidi Hoffman is a 82 y.o. female who presents for evaluation of chest pain.  She has noted that when she walks she will get chest discomfort.  This has been occurring for a few months.  It occurs almost everytime she starts to walk.  She says that she can walk around 30 minutes and then the chest discomfort will begin.  She describes it as a pressure in the midsternal area with no radiation.  It is associated with SOB and some diaphoresis.  She denies any nausea.  It resolves when she stops walking.  She has no chest pain with rest.  She has a history of remote tobacco use 16 years ago.  There is some ? Of heart disease with her mom.  She denies any dizziness, palpitations or syncope.  She occasionally has some LE edema.  She denies any claudication symptoms.      Past Medical History  Diagnosis Date  . Asthma   . GERD (gastroesophageal reflux disease)   . Hyperlipidemia   . Hypertension   . Osteoporosis   . COPD (chronic obstructive pulmonary disease) ([place])   . Allergy   . Anemia   . Diverticulosis   . Tubular adenoma of colon 10/01    Past Surgical History  Procedure Laterality Date  . Cardiac catheterization  2001    She denies  . Total hip arthroplasty  11/20/2011    Procedure: TOTAL HIP ARTHROPLASTY;  Surgeon: Tobi Bastos, MD;  Location: [facility];  Service: Orthopedics;  Laterality: Left;     Current Outpatient Prescriptions  Medication Sig Dispense Refill  . albuterol (PROVENTIL) (2.5 MG/3ML) 0.083% nebulizer solution INHALE ONE VIAL VIA NEBULIZER EVERY SIX HOURS AS NEEDED for wheezing 150 mL 0  . Albuterol Sulfate (PROAIR RESPICLICK) 123XX123 (90 Base) MCG/ACT AEPB Inhale 1 puff into the lungs 3 (three) times daily as needed (wheezing/  shortness of breath).    Marland Kitchen aspirin 81 MG tablet Take 81 mg by mouth daily.    Marland Kitchen atorvastatin (LIPITOR) 10 MG tablet Take 1 tablet (10 mg total) by mouth daily. 90 tablet 3  . Calcium-Vitamin D (CALTRATE 600 PLUS-VIT D PO) Take 1 tablet by mouth daily.     Mariane Baumgarten Calcium (STOOL SOFTENER PO) Take 1 capsule by mouth as needed (constipation).     . Fluticasone-Salmeterol (ADVAIR) 250-50 MCG/DOSE AEPB Inhale 1 puff into the lungs 2 (two) times daily.    Marland Kitchen lisinopril (PRINIVIL,ZESTRIL) 10 MG tablet Take 1 tablet (10 mg total) by mouth daily. 90 tablet 1  . losartan-hydrochlorothiazide (HYZAAR) 100-25 MG tablet Take 1 tablet by mouth every morning. 90 tablet 4  . Multiple Vitamins-Minerals (CENTRUM SILVER ULTRA WOMENS PO) Take 1 tablet by mouth daily.      No current facility-administered medications for this visit.    Allergies:   Review of patient's allergies indicates no known allergies.    Social History:  The patient  reports that she quit smoking about 14 years ago. Her smoking use included Cigarettes. She has a 100 pack-year smoking history. She has never used smokeless tobacco. She reports that she drinks about 0.6 oz of  alcohol per week. She reports that she does not use illicit drugs.   Family History:  The patient's family history includes Asthma in her daughter and son; Diabetes in her sister; Heart disease in her mother. There is no history of Colon cancer, Esophageal cancer, Rectal cancer, Stomach cancer, or Pancreatic cancer.    ROS:  Please see the history of present illness.   Otherwise, review of systems are positive for none.   All other systems are reviewed and negative.    PHYSICAL EXAM: VS:  BP 148/80 mmHg  Pulse 98  Ht 5\' 5"  (1.651 m)  Wt 187 lb 12.8 oz (85.186 kg)  BMI 31.25 kg/m2 , BMI Body mass index is 31.25 kg/(m^2). GEN: Well nourished, well developed, in no acute distress HEENT: normal Neck: no JVD, carotid bruits, or masses Cardiac: RRR; no murmurs, rubs,  or gallops,no edema  Respiratory:  clear to auscultation bilaterally, normal work of breathing GI: soft, nontender, nondistended, + BS MS: no deformity or atrophy Skin: warm and dry, no rash Neuro:  Strength and sensation are intact Psych: euthymic mood, full affect   EKG:  EKG is ordered today. The ekg ordered today demonstrates NSR with biatrial enlargement and nonspecific ST abnormality   Recent Labs: 11/15/2014: ALT 14; Hemoglobin 11.3*; Platelets 351.0; TSH 3.87 03/07/2015: BUN 12; Creatinine, Ser 1.14; Potassium 4.3; Sodium 135    Lipid Panel    Component Value Date/Time   CHOL 236* 11/15/2014 1049   TRIG 149.0 11/15/2014 1049   HDL 53.10 11/15/2014 1049   CHOLHDL 4 11/15/2014 1049   VLDL 29.8 11/15/2014 1049   LDLCALC 153* 11/15/2014 1049   LDLDIRECT 167.1 05/28/2012 1019      Wt Readings from Last 3 Encounters:  03/07/15 187 lb 12.8 oz (85.186 kg)  02/21/15 189 lb 6.4 oz (85.911 kg)  12/06/14 193 lb 14.4 oz (87.952 kg)        ASSESSMENT AND PLAN:  1.  Exertional Chest pain with multiple CRF including dyslipidemia, HTN, family history of CAD and remote tobacco use.  Symptoms are worrisome for exertional angina.  I will get a stress myoview to rule out ischemia and 2D echo to assess LVF.  Continue ASA/statin.   2.  HTN - controlled on ACE I and diuretic 3.  Dyslipidemia - per PCP - continue statin   Current medicines are reviewed at length with the patient today.  The patient does not have concerns regarding medicines.  The following changes have been made:  no change  Labs/ tests ordered today: See above Assessment and Plan No orders of the defined types were placed in this encounter.     Disposition:   FU with me PRN pending results of stress test.   Lurena Nida, MD  03/07/2015 11:26 AM    [facility] [address], [place], [place]  [address] Phone: [phone]; Fax: [phone]

## 2015-03-08 ENCOUNTER — Telehealth: Payer: Self-pay | Admitting: Adult Health

## 2015-03-08 LAB — PTH, INTACT AND CALCIUM
CALCIUM: 11.4 mg/dL — AB (ref 8.4–10.5)
PTH: 100 pg/mL — AB (ref 14–64)

## 2015-03-08 NOTE — Telephone Encounter (Signed)
Left VM to call back re: labs  

## 2015-03-09 ENCOUNTER — Other Ambulatory Visit: Payer: Self-pay | Admitting: Adult Health

## 2015-03-09 ENCOUNTER — Telehealth: Payer: Self-pay | Admitting: Adult Health

## 2015-03-09 DIAGNOSIS — E215 Disorder of parathyroid gland, unspecified: Secondary | ICD-10-CM

## 2015-03-09 DIAGNOSIS — I10 Essential (primary) hypertension: Secondary | ICD-10-CM

## 2015-03-09 MED ORDER — LISINOPRIL 30 MG PO TABS: 30.0000 mg | ORAL_TABLET | Freq: Every day | ORAL | Status: DC

## 2015-03-09 NOTE — Telephone Encounter (Signed)
[facility] would like to confirm pt is supposed to be on losartan-hydrochlorothiazide (HYZAAR) 100-25 MG tablet And lisinopril (PRINIVIL,ZESTRIL) 10 MG tablet  Pharmacist states they generally don't see these 2 meds together and wants to confirm  with the dr.

## 2015-03-09 NOTE — Telephone Encounter (Signed)
Spoke to patient and informed her of her PTH results, I explained the findings. Will send to General Surgery for evaluation.   Going to take patient of Hyzaar to see if kidney function improves. Will place on 30 mg Lisinopril. She will follow up next week in the office.

## 2015-03-10 ENCOUNTER — Other Ambulatory Visit: Payer: Self-pay | Admitting: Adult Health

## 2015-03-10 NOTE — Telephone Encounter (Signed)
Can we get her scheduled for sometime on March 13th?  Thank you

## 2015-03-10 NOTE — Telephone Encounter (Signed)
Pt scheduled for 3.13.2017 at 10:30 am.

## 2015-03-11 NOTE — Progress Notes (Signed)
Quick Note:  Called spoke with patient, advised of cxr results as stated by PM. Pt verbalized her understanding and denied any questions. ______ 

## 2015-03-14 ENCOUNTER — Telehealth: Payer: Self-pay | Admitting: Adult Health

## 2015-03-14 NOTE — Telephone Encounter (Signed)
Daughter called to ask Heidi Hoffman to please give her a call.  Heidi Hoffman  states she was to call back this week Also she would like a copy of lab results from 2 weeks ago.

## 2015-03-15 ENCOUNTER — Other Ambulatory Visit: Payer: Self-pay | Admitting: General Surgery

## 2015-03-16 NOTE — Telephone Encounter (Signed)
Returned call to daughter. Answered questions that she had

## 2015-03-21 ENCOUNTER — Ambulatory Visit (INDEPENDENT_AMBULATORY_CARE_PROVIDER_SITE_OTHER): Payer: Medicare Other | Admitting: Adult Health

## 2015-03-21 ENCOUNTER — Encounter: Payer: Self-pay | Admitting: Adult Health

## 2015-03-21 VITALS — BP 124/70 | Temp 97.9°F | Ht 65.0 in | Wt 187.2 lb

## 2015-03-21 DIAGNOSIS — I208 Other forms of angina pectoris: Secondary | ICD-10-CM | POA: Diagnosis not present

## 2015-03-21 DIAGNOSIS — I10 Essential (primary) hypertension: Secondary | ICD-10-CM

## 2015-03-21 NOTE — Patient Instructions (Signed)
It was great seeing you again!  Lets keep your blood pressure medication where it is.   Follow up with me as needed

## 2015-03-21 NOTE — Progress Notes (Signed)
Subjective:    Patient ID: Heidi Hoffman, female    DOB: [birth year], 82 y.o.   MRN: [id number]  HPI  82 year old patient who presents to the office today for follow up regarding HTN. Currently her BP is BP: 124/70 mmHg and she is taking Losaran-HCTZ 25-100mg . She feels as though her blood pressure is well controlled. Denies any headaches, dizziness, blurred vision, or syncope.   She has an upcoming appointment with gen surg to find out which parathyroid is not working properly.   Review of Systems  Constitutional: Negative.   Respiratory: Negative.   Cardiovascular: Negative.   Gastrointestinal: Negative.   Musculoskeletal: Negative.   Psychiatric/Behavioral: Negative.   All other systems reviewed and are negative.  Past Medical History  Diagnosis Date  . Asthma   . GERD (gastroesophageal reflux disease)   . Hyperlipidemia   . Hypertension   . Osteoporosis   . COPD (chronic obstructive pulmonary disease) ([place])   . Allergy   . Anemia   . Diverticulosis   . Tubular adenoma of colon 10/01    Social History   Social History  . Marital Status: Single    Spouse Name: N/A  . Number of Children: N/A  . Years of Education: N/A   Occupational History  . Not on file.   Social History Main Topics  . Smoking status: Former Smoker -- 2.00 packs/day for 50 years    Types: Cigarettes    Quit date: 05/09/2000  . Smokeless tobacco: Never Used  . Alcohol Use: 0.6 oz/week    1 Glasses of wine per week     Comment: occ.  . Drug Use: No  . Sexual Activity: Not Currently   Other Topics Concern  . Not on file   Social History Narrative   -Patient has never been married.    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    - Likes to read and watch TV.            Past Surgical History  Procedure Laterality Date  . Cardiac catheterization  2001    She denies  . Total hip arthroplasty  11/20/2011   Procedure: TOTAL HIP ARTHROPLASTY;  Surgeon: Tobi Bastos, MD;  Location: [facility];  Service: Orthopedics;  Laterality: Left;    Family History  Problem Relation Age of Onset  . Heart disease Mother   . Diabetes Sister   . Asthma Daughter   . Asthma Son   . Colon cancer Neg Hx   . Esophageal cancer Neg Hx   . Rectal cancer Neg Hx   . Stomach cancer Neg Hx   . Pancreatic cancer Neg Hx     No Known Allergies  Current Outpatient Prescriptions on File Prior to Visit  Medication Sig Dispense Refill  . albuterol (PROVENTIL) (2.5 MG/3ML) 0.083% nebulizer solution INHALE ONE VIAL VIA NEBULIZER EVERY SIX HOURS AS NEEDED for wheezing 150 mL 0  . Albuterol Sulfate (PROAIR RESPICLICK) 123XX123 (90 Base) MCG/ACT AEPB Inhale 1 puff into the lungs 3 (three) times daily as needed (wheezing/ shortness of breath).    Marland Kitchen aspirin 81 MG tablet Take 81 mg by mouth daily.    Marland Kitchen atorvastatin (LIPITOR) 10 MG tablet Take 1 tablet (10 mg total) by mouth daily. 90 tablet 3  . Calcium-Vitamin D (CALTRATE 600 PLUS-VIT D PO) Take 1  tablet by mouth daily.     Mariane Baumgarten Calcium (STOOL SOFTENER PO) Take 1 capsule by mouth as needed (constipation).     . Fluticasone-Salmeterol (ADVAIR) 250-50 MCG/DOSE AEPB Inhale 1 puff into the lungs 2 (two) times daily.    Marland Kitchen lisinopril (PRINIVIL,ZESTRIL) 30 MG tablet Take 1 tablet (30 mg total) by mouth daily. 30 tablet 3  . Multiple Vitamins-Minerals (CENTRUM SILVER ULTRA WOMENS PO) Take 1 tablet by mouth daily.      No current facility-administered medications on file prior to visit.    BP 124/70 mmHg  Temp(Src) 97.9 F (36.6 C) (Oral)  Ht 5\' 5"  (1.651 m)  Wt 187 lb 3.2 oz (84.913 kg)  BMI 31.15 kg/m2       Objective:   Physical Exam  Constitutional: She is oriented to person, place, and time. She appears well-developed and well-nourished. No distress.  Cardiovascular: Normal rate, regular rhythm, normal heart sounds and intact distal pulses.  Exam reveals no gallop and  no friction rub.   No murmur heard. Pulmonary/Chest: Effort normal and breath sounds normal. No respiratory distress. She has no wheezes. She has no rales. She exhibits no tenderness.  Neurological: She is alert and oriented to person, place, and time.  Skin: She is not diaphoretic.  Vitals reviewed.     Assessment & Plan:

## 2015-03-21 NOTE — Progress Notes (Signed)
Pre visit review using our clinic review tool, if applicable. No additional management support is needed unless otherwise documented below in the visit note. 

## 2015-03-23 ENCOUNTER — Telehealth (HOSPITAL_COMMUNITY): Payer: Self-pay | Admitting: *Deleted

## 2015-03-23 NOTE — Telephone Encounter (Signed)
Patient given detailed instructions per Myocardial Perfusion Study Information Sheet for the test on 03/28/15. Patient notified to arrive 15 minutes early and that it is imperative to arrive on time for appointment to keep from having the test rescheduled.  If you need to cancel or reschedule your appointment, please call the office within 24 hours of your appointment. Failure to do so may result in a cancellation of your appointment, and a $50 no show fee. Patient verbalized understanding. Hubbard Robinson, RN

## 2015-03-28 ENCOUNTER — Ambulatory Visit
Admission: RE | Admit: 2015-03-28 | Discharge: 2015-03-28 | Disposition: A | Discharge status: Home / Self Care | Payer: Medicare Other | Source: Ambulatory Visit | Attending: General Surgery | Admitting: General Surgery

## 2015-03-28 ENCOUNTER — Other Ambulatory Visit: Payer: Self-pay

## 2015-03-28 ENCOUNTER — Ambulatory Visit (HOSPITAL_COMMUNITY): Payer: Medicare Other | Attending: Cardiology

## 2015-03-28 ENCOUNTER — Ambulatory Visit (HOSPITAL_BASED_OUTPATIENT_CLINIC_OR_DEPARTMENT_OTHER): Payer: Medicare Other

## 2015-03-28 DIAGNOSIS — R079 Chest pain, unspecified: Secondary | ICD-10-CM

## 2015-03-28 DIAGNOSIS — Z87891 Personal history of nicotine dependence: Secondary | ICD-10-CM | POA: Diagnosis not present

## 2015-03-28 DIAGNOSIS — R0602 Shortness of breath: Secondary | ICD-10-CM | POA: Insufficient documentation

## 2015-03-28 DIAGNOSIS — E785 Hyperlipidemia, unspecified: Secondary | ICD-10-CM | POA: Insufficient documentation

## 2015-03-28 DIAGNOSIS — I77810 Thoracic aortic ectasia: Secondary | ICD-10-CM | POA: Insufficient documentation

## 2015-03-28 DIAGNOSIS — Z8249 Family history of ischemic heart disease and other diseases of the circulatory system: Secondary | ICD-10-CM | POA: Insufficient documentation

## 2015-03-28 DIAGNOSIS — R9439 Abnormal result of other cardiovascular function study: Secondary | ICD-10-CM | POA: Insufficient documentation

## 2015-03-28 DIAGNOSIS — R0609 Other forms of dyspnea: Secondary | ICD-10-CM | POA: Insufficient documentation

## 2015-03-28 DIAGNOSIS — E041 Nontoxic single thyroid nodule: Secondary | ICD-10-CM | POA: Diagnosis not present

## 2015-03-28 DIAGNOSIS — I119 Hypertensive heart disease without heart failure: Secondary | ICD-10-CM | POA: Diagnosis not present

## 2015-03-28 LAB — MYOCARDIAL PERFUSION IMAGING
CHL CUP NUCLEAR SDS: 5
CHL CUP NUCLEAR SRS: 5
CHL CUP NUCLEAR SSS: 10
LHR: 0.26
LV sys vol: 20 mL
LVDIAVOL: 91 mL (ref 46–106)
Peak HR: 107 {beats}/min
Rest HR: 74 {beats}/min
TID: 0.93

## 2015-03-28 MED ORDER — REGADENOSON 0.4 MG/5ML IV SOLN
0.4000 mg | Freq: Once | INTRAVENOUS | Status: AC
  Administered 2015-03-28: 0.4 mg via INTRAVENOUS

## 2015-03-28 MED ORDER — TECHNETIUM TC 99M SESTAMIBI GENERIC - CARDIOLITE
32.3000 | Freq: Once | INTRAVENOUS | Status: AC | PRN
  Administered 2015-03-28: 32.3 via INTRAVENOUS

## 2015-03-28 MED ORDER — TECHNETIUM TC 99M SESTAMIBI GENERIC - CARDIOLITE
10.6000 | Freq: Once | INTRAVENOUS | Status: AC | PRN
  Administered 2015-03-28: 11 via INTRAVENOUS

## 2015-03-29 ENCOUNTER — Other Ambulatory Visit (HOSPITAL_COMMUNITY): Payer: Self-pay | Admitting: General Surgery

## 2015-03-29 DIAGNOSIS — R109 Unspecified abdominal pain: Secondary | ICD-10-CM

## 2015-03-30 ENCOUNTER — Other Ambulatory Visit (HOSPITAL_COMMUNITY): Payer: Self-pay | Admitting: General Surgery

## 2015-03-30 ENCOUNTER — Telehealth: Payer: Self-pay | Admitting: Adult Health

## 2015-03-30 NOTE — Telephone Encounter (Signed)
Pt daughter Aniceto Boss would like cory to return her call concerning her mother

## 2015-03-30 NOTE — Telephone Encounter (Signed)
See below

## 2015-04-01 ENCOUNTER — Encounter: Payer: Self-pay | Admitting: *Deleted

## 2015-04-01 ENCOUNTER — Ambulatory Visit (INDEPENDENT_AMBULATORY_CARE_PROVIDER_SITE_OTHER): Payer: Medicare Other | Admitting: Nurse Practitioner

## 2015-04-01 ENCOUNTER — Encounter: Payer: Self-pay | Admitting: Nurse Practitioner

## 2015-04-01 VITALS — BP 130/80 | HR 72 | Ht 65.0 in | Wt 189.2 lb

## 2015-04-01 DIAGNOSIS — I10 Essential (primary) hypertension: Secondary | ICD-10-CM | POA: Insufficient documentation

## 2015-04-01 DIAGNOSIS — I5189 Other ill-defined heart diseases: Secondary | ICD-10-CM | POA: Insufficient documentation

## 2015-04-01 DIAGNOSIS — I200 Unstable angina: Secondary | ICD-10-CM | POA: Diagnosis not present

## 2015-04-01 DIAGNOSIS — Z01812 Encounter for preprocedural laboratory examination: Secondary | ICD-10-CM | POA: Insufficient documentation

## 2015-04-01 DIAGNOSIS — E785 Hyperlipidemia, unspecified: Secondary | ICD-10-CM | POA: Diagnosis not present

## 2015-04-01 DIAGNOSIS — I119 Hypertensive heart disease without heart failure: Secondary | ICD-10-CM

## 2015-04-01 DIAGNOSIS — I519 Heart disease, unspecified: Secondary | ICD-10-CM

## 2015-04-01 LAB — CBC
HCT: 30.8 % — ABNORMAL LOW (ref 36.0–46.0)
HEMOGLOBIN: 10.3 g/dL — AB (ref 12.0–15.0)
MCH: 29.6 pg (ref 26.0–34.0)
MCHC: 33.4 g/dL (ref 30.0–36.0)
MCV: 88.5 fL (ref 78.0–100.0)
MPV: 10.4 fL (ref 8.6–12.4)
Platelets: 404 10*3/uL — ABNORMAL HIGH (ref 150–400)
RBC: 3.48 MIL/uL — ABNORMAL LOW (ref 3.87–5.11)
RDW: 14.4 % (ref 11.5–15.5)
WBC: 7.9 10*3/uL (ref 4.0–10.5)

## 2015-04-01 MED ORDER — NITROGLYCERIN 0.4 MG SL SUBL: 0.4000 mg | SUBLINGUAL_TABLET | SUBLINGUAL | Status: AC | PRN

## 2015-04-01 NOTE — Patient Instructions (Signed)
Medication Instructions:  1)  Start Nitroglycerin 0.4mg  as needed for chest pain.  The proper use and anticipated side effects of nitroglycerine has been carefully explained.  If a single episode of chest pain is not relieved by one tablet, the patient will try another within 5 minutes; and if this doesn't relieve the pain, the patient is instructed to call 911 for transportation to an emergency department.   Labwork: BMET, CBC and INR today  Testing/Procedures: Your physician has requested that you have a cardiac catheterization. Cardiac catheterization is used to diagnose and/or treat various heart conditions. Doctors may recommend this procedure for a number of different reasons. The most common reason is to evaluate chest pain. Chest pain can be a symptom of coronary artery disease (CAD), and cardiac catheterization can show whether plaque is narrowing or blocking your heart's arteries. This procedure is also used to evaluate the valves, as well as measure the blood flow and oxygen levels in different parts of your heart. For further information please visit [email or web address]. Please follow instruction sheet, as given.   Follow-Up: Will be determined after your catheterization.   Any Other Special Instructions Will Be Listed Below (If Applicable).     If you need a refill on your cardiac medications before your next appointment, please call your pharmacy.

## 2015-04-01 NOTE — Progress Notes (Signed)
Cardiology Clinic Note   Patient Name: Heidi Hoffman Date of Encounter: 04/01/2015  Primary Care Provider:  Dorothyann Peng, NP Primary Cardiologist:  T. Turner, MD  Patient Profile    An 82 year old ?with a several month history of exertional chest pain who presents for follow-up after recent abnormal stress testing.  Past Medical History    Past Medical History  Diagnosis Date  . Asthma   . GERD (gastroesophageal reflux disease)   . Hyperlipidemia   . Hypertensive heart disease   . Osteoporosis   . COPD (chronic obstructive pulmonary disease) ([place])   . Allergy   . Anemia   . Diverticulosis   . Tubular adenoma of colon 10/01  . Unstable angina ([place])     a. 03/2015 MV: mod size and intensity, partially reversible antsept, sept, apical defect, EF 78%.  . Diastolic dysfunction     a. 03/2015 Echo: EF 55-60%, no rwma, Gr1 DD.    Past Surgical History  Procedure Laterality Date  . Cardiac catheterization  2001    She denies  . Total hip arthroplasty  11/20/2011    Procedure: TOTAL HIP ARTHROPLASTY;  Surgeon: Tobi Bastos, MD;  Location: [facility];  Service: Orthopedics;  Laterality: Left;    Allergies  No Known Allergies  History of Present Illness    82 year old female with the above complex past medical history. She was recently evaluated by Dr. Radford Pax secondary to a several month history of exertional substernal chest pain and dyspnea. Patient has a history of asthma and also a long history of tobacco abuse, quitting approximately 12 years ago, and she thought her symptoms may have been related to asthma. She describes chest pain as tightness that occurs with relatively minimal activity, lasting 10-15 minutes, resolving with rest. Following her visit with Dr. Radford Pax, she underwent stress testing which revealed a moderate size and intensity, partially reversible anteroseptal, septal, and apical defect with an EF of 70%. Echocardiogram was also performed and showed an EF of  55-60% without regional wall motion abnormalities. Grade 1 diastolic dysfunction was noted. As result of these findings, patient was contacted so that we may arrange diagnostic catheterization. She has continued to have exertional chest pain since her last visit. She denies PND, orthopnea, dizziness, syncope, edema, or early satiety. Further, she has never had chest pain at rest.  Home Medications    Prior to Admission medications   Medication Sig Start Date End Date Taking? Authorizing Provider  albuterol (PROVENTIL) (2.5 MG/3ML) 0.083% nebulizer solution INHALE ONE VIAL VIA NEBULIZER EVERY SIX HOURS AS NEEDED for wheezing 02/25/14  Yes Bruce Kendall Flack, MD  Albuterol Sulfate (PROAIR RESPICLICK) 123XX123 (90 Base) MCG/ACT AEPB Inhale 1 puff into the lungs 3 (three) times daily as needed (wheezing/ shortness of breath).   Yes Historical Provider, MD  aspirin 81 MG tablet Take 81 mg by mouth daily.   Yes Historical Provider, MD  atorvastatin (LIPITOR) 10 MG tablet Take 1 tablet (10 mg total) by mouth daily. 11/22/14  Yes Dorothyann Peng, NP  Calcium-Vitamin D (CALTRATE 600 PLUS-VIT D PO) Take 1 tablet by mouth daily.    Yes Historical Provider, MD  Docusate Calcium (STOOL SOFTENER PO) Take 1 capsule by mouth as needed (constipation).    Yes Historical Provider, MD  Fluticasone-Salmeterol (ADVAIR) 250-50 MCG/DOSE AEPB Inhale 1 puff into the lungs 2 (two) times daily.   Yes Historical Provider, MD  lisinopril (PRINIVIL,ZESTRIL) 30 MG tablet Take 1 tablet (30 mg total) by mouth daily. 03/09/15  Yes Dorothyann Peng, NP  losartan-hydrochlorothiazide (HYZAAR) 100-25 MG tablet Take 1 tablet by mouth daily.   Yes Historical Provider, MD  Multiple Vitamins-Minerals (CENTRUM SILVER ULTRA WOMENS PO) Take 1 tablet by mouth daily.    Yes Historical Provider, MD  nitroGLYCERIN (NITROSTAT) 0.4 MG SL tablet Place 1 tablet (0.4 mg total) under the tongue every 5 (five) minutes as needed for chest pain. 04/01/15   Rogelia Mire, NP    Family History    Family History  Problem Relation Age of Onset  . Heart disease Mother   . Diabetes Sister   . Asthma Daughter   . Asthma Son   . Colon cancer Neg Hx   . Esophageal cancer Neg Hx   . Rectal cancer Neg Hx   . Stomach cancer Neg Hx   . Pancreatic cancer Neg Hx   . Hypertension Mother   . Stroke Sister   . Stroke Brother     Social History    Social History   Social History  . Marital Status: Single    Spouse Name: N/A  . Number of Children: N/A  . Years of Education: N/A   Occupational History  . Not on file.   Social History Main Topics  . Smoking status: Former Smoker -- 2.00 packs/day for 50 years    Types: Cigarettes    Quit date: 05/09/2000  . Smokeless tobacco: Never Used  . Alcohol Use: 0.6 oz/week    1 Glasses of wine per week     Comment: occ.  . Drug Use: No  . Sexual Activity: Not Currently   Other Topics Concern  . Not on file   Social History Narrative   -Patient has never been married.    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    - Likes to read and watch TV.             Review of Systems    General:  No chills, fever, night sweats or weight changes.  Cardiovascular:  Positive exertional chest pain, dyspnea on exertion, no edema, orthopnea, palpitations, paroxysmal nocturnal dyspnea. Dermatological: No rash, lesions/masses Respiratory: No cough, dyspnea Urologic: No hematuria, dysuria Abdominal:   No nausea, vomiting, diarrhea, bright red blood per rectum, melena, or hematemesis Neurologic:  No visual changes, wkns, changes in mental status. All other systems reviewed and are otherwise negative except as noted above.  Physical Exam    VS:  BP 130/80 mmHg  Pulse 72  Ht 5\' 5"  (1.651 m)  Wt 189 lb 3.2 oz (85.821 kg)  BMI 31.48 kg/m2 , BMI Body mass index is 31.48 kg/(m^2). GEN: Well nourished, well developed, in no acute  distress. HEENT: normal.Xanthelasma present. Neck: Supple, no JVD, carotid bruits, or masses. Cardiac: RRR, no murmurs, rubs, or gallops. No clubbing, cyanosis, edema.  Radials/DP/PT 2+ and equal bilaterally.  Respiratory:  Respirations regular and unlabored, clear to auscultation bilaterally. GI: Soft, nontender, nondistended, BS + x 4. MS: no deformity or atrophy. Skin: warm and dry, no rash. Neuro:  Strength and sensation are intact. Psych: Normal affect.  Accessory Clinical Findings    ECG - EKG from 03/07/2015 reviewed-regular sinus rhythm, 98, biatrial enlargement, nonspecific ST and T changes.  Assessment & Plan   1.  Unstable angina: Patient has a several month history of exertional chest pain and dyspnea and  recently underwent stress testing which was intermediate risk with suggestion of anteroseptal, septal, and apical  ischemia. She has continued to have intermittent exertional chest pain and dyspnea. We discussed the next appropriate steps including diagnostic catheterization. We covered details of the procedure at length. The patient understands that risks include but are not limited to stroke (1 in 1000), death (1 in 101), kidney failure [usually temporary] (1 in 500), bleeding (1 in 200), allergic reaction [possibly serious] (1 in 200), and agrees to proceed.  She is scheduled for diagnostic catheterization for Monday, March 27 at 7:30 AM with Dr. Martinique at [facility]. She remains on aspirin and statin therapy. I have given her prescription for sublingual nitroglycerin. CBC, basic metabolic profile, and INR today. She will also need a chest x-ray.   2. Hypertensive heart disease: Blood pressure stable today. I note that she is on both lisinopril and Hyzaar. These appear to be long-term medications. We will follow up with a basic metabolic panel today as part of her precatheterization labs.  3. Hyperlipidemia: She is on statin therapy. LDL was 153 in November 2016. This will need  some follow-up.    4. Diastolic dysfunction: Euvolemic on exam. Heart rate and blood pressure stable.  5. Disposition: Plan for diagnostic catheterization early next week with office follow-up in approximately 2 weeks.  Murray Hodgkins, NP 04/01/2015, 1:55 PM

## 2015-04-02 LAB — BASIC METABOLIC PANEL
BUN: 17 mg/dL (ref 7–25)
CO2: 22 mmol/L (ref 20–31)
Calcium: 11.6 mg/dL — ABNORMAL HIGH (ref 8.6–10.4)
Chloride: 102 mmol/L (ref 98–110)
Creat: 1.11 mg/dL — ABNORMAL HIGH (ref 0.60–0.88)
GLUCOSE: 81 mg/dL (ref 65–99)
POTASSIUM: 4.6 mmol/L (ref 3.5–5.3)
SODIUM: 137 mmol/L (ref 135–146)

## 2015-04-02 LAB — PROTIME-INR
INR: 0.92 (ref <1.50)
PROTHROMBIN TIME: 12.5 s (ref 11.6–15.2)

## 2015-04-04 ENCOUNTER — Encounter (HOSPITAL_COMMUNITY)
Admission: RE | Disposition: A | Discharge status: Home / Self Care | Payer: Self-pay | Source: Ambulatory Visit | Attending: Cardiology

## 2015-04-04 ENCOUNTER — Telehealth: Payer: Self-pay

## 2015-04-04 ENCOUNTER — Encounter (HOSPITAL_COMMUNITY): Payer: Self-pay | Admitting: Cardiology

## 2015-04-04 ENCOUNTER — Ambulatory Visit (HOSPITAL_COMMUNITY)
Admission: RE | Admit: 2015-04-04 | Discharge: 2015-04-04 | Disposition: A | Discharge status: Home / Self Care | Payer: Medicare Other | Source: Ambulatory Visit | Attending: Cardiology | Admitting: Cardiology

## 2015-04-04 DIAGNOSIS — I5030 Unspecified diastolic (congestive) heart failure: Secondary | ICD-10-CM | POA: Insufficient documentation

## 2015-04-04 DIAGNOSIS — J45909 Unspecified asthma, uncomplicated: Secondary | ICD-10-CM | POA: Insufficient documentation

## 2015-04-04 DIAGNOSIS — D649 Anemia, unspecified: Secondary | ICD-10-CM | POA: Diagnosis not present

## 2015-04-04 DIAGNOSIS — Z7982 Long term (current) use of aspirin: Secondary | ICD-10-CM | POA: Diagnosis not present

## 2015-04-04 DIAGNOSIS — E785 Hyperlipidemia, unspecified: Secondary | ICD-10-CM | POA: Diagnosis not present

## 2015-04-04 DIAGNOSIS — I5189 Other ill-defined heart diseases: Secondary | ICD-10-CM | POA: Diagnosis present

## 2015-04-04 DIAGNOSIS — R9439 Abnormal result of other cardiovascular function study: Secondary | ICD-10-CM | POA: Diagnosis present

## 2015-04-04 DIAGNOSIS — K219 Gastro-esophageal reflux disease without esophagitis: Secondary | ICD-10-CM | POA: Insufficient documentation

## 2015-04-04 DIAGNOSIS — Z8249 Family history of ischemic heart disease and other diseases of the circulatory system: Secondary | ICD-10-CM | POA: Diagnosis not present

## 2015-04-04 DIAGNOSIS — I110 Hypertensive heart disease with heart failure: Secondary | ICD-10-CM | POA: Diagnosis not present

## 2015-04-04 DIAGNOSIS — I200 Unstable angina: Secondary | ICD-10-CM | POA: Diagnosis present

## 2015-04-04 DIAGNOSIS — I119 Hypertensive heart disease without heart failure: Secondary | ICD-10-CM

## 2015-04-04 DIAGNOSIS — I10 Essential (primary) hypertension: Secondary | ICD-10-CM | POA: Diagnosis present

## 2015-04-04 DIAGNOSIS — Z87891 Personal history of nicotine dependence: Secondary | ICD-10-CM | POA: Insufficient documentation

## 2015-04-04 DIAGNOSIS — J449 Chronic obstructive pulmonary disease, unspecified: Secondary | ICD-10-CM | POA: Insufficient documentation

## 2015-04-04 DIAGNOSIS — I77810 Thoracic aortic ectasia: Secondary | ICD-10-CM

## 2015-04-04 DIAGNOSIS — R931 Abnormal findings on diagnostic imaging of heart and coronary circulation: Secondary | ICD-10-CM | POA: Diagnosis not present

## 2015-04-04 HISTORY — PX: CARDIAC CATHETERIZATION: SHX172

## 2015-04-04 SURGERY — LEFT HEART CATH AND CORONARY ANGIOGRAPHY
Anesthesia: LOCAL

## 2015-04-04 MED ORDER — SODIUM CHLORIDE 0.9 % WEIGHT BASED INFUSION: 1.0000 mL/kg/h | INTRAVENOUS | Status: DC

## 2015-04-04 MED ORDER — HEPARIN (PORCINE) IN NACL 2-0.9 UNIT/ML-% IJ SOLN
INTRAMUSCULAR | Status: DC | PRN
  Administered 2015-04-04: 1500 mL

## 2015-04-04 MED ORDER — FENTANYL CITRATE (PF) 100 MCG/2ML IJ SOLN
INTRAMUSCULAR | Status: AC
  Filled 2015-04-04: qty 2

## 2015-04-04 MED ORDER — IOPAMIDOL (ISOVUE-370) INJECTION 76%
INTRAVENOUS | Status: AC
  Filled 2015-04-04: qty 100

## 2015-04-04 MED ORDER — SODIUM CHLORIDE 0.9% FLUSH: 3.0000 mL | INTRAVENOUS | Status: DC | PRN

## 2015-04-04 MED ORDER — SODIUM CHLORIDE 0.9 % IV SOLN: 250.0000 mL | INTRAVENOUS | Status: DC | PRN

## 2015-04-04 MED ORDER — HEPARIN SODIUM (PORCINE) 1000 UNIT/ML IJ SOLN
INTRAMUSCULAR | Status: AC
  Filled 2015-04-04: qty 1

## 2015-04-04 MED ORDER — IOPAMIDOL (ISOVUE-370) INJECTION 76%
INTRAVENOUS | Status: DC | PRN
  Administered 2015-04-04: 50 mL via INTRAVENOUS

## 2015-04-04 MED ORDER — LIDOCAINE HCL (PF) 1 % IJ SOLN
INTRAMUSCULAR | Status: AC
  Filled 2015-04-04: qty 30

## 2015-04-04 MED ORDER — FENTANYL CITRATE (PF) 100 MCG/2ML IJ SOLN
INTRAMUSCULAR | Status: DC | PRN
  Administered 2015-04-04: 25 ug via INTRAVENOUS

## 2015-04-04 MED ORDER — ASPIRIN 81 MG PO CHEW: 81.0000 mg | CHEWABLE_TABLET | ORAL | Status: DC

## 2015-04-04 MED ORDER — HEPARIN (PORCINE) IN NACL 2-0.9 UNIT/ML-% IJ SOLN
INTRAMUSCULAR | Status: AC
  Filled 2015-04-04: qty 500

## 2015-04-04 MED ORDER — VERAPAMIL HCL 2.5 MG/ML IV SOLN
INTRAVENOUS | Status: DC | PRN
  Administered 2015-04-04: 08:00:00 via INTRA_ARTERIAL

## 2015-04-04 MED ORDER — MIDAZOLAM HCL 2 MG/2ML IJ SOLN
INTRAMUSCULAR | Status: DC | PRN
  Administered 2015-04-04: 1 mg via INTRAVENOUS

## 2015-04-04 MED ORDER — SODIUM CHLORIDE 0.9% FLUSH: 3.0000 mL | Freq: Two times a day (BID) | INTRAVENOUS | Status: DC

## 2015-04-04 MED ORDER — MIDAZOLAM HCL 2 MG/2ML IJ SOLN
INTRAMUSCULAR | Status: AC
  Filled 2015-04-04: qty 2

## 2015-04-04 MED ORDER — HEPARIN SODIUM (PORCINE) 1000 UNIT/ML IJ SOLN
INTRAMUSCULAR | Status: DC | PRN
  Administered 2015-04-04: 4000 [IU] via INTRAVENOUS

## 2015-04-04 MED ORDER — LIDOCAINE HCL (PF) 1 % IJ SOLN
INTRAMUSCULAR | Status: DC | PRN
  Administered 2015-04-04: 2 mL via SUBCUTANEOUS

## 2015-04-04 MED ORDER — SODIUM CHLORIDE 0.9 % WEIGHT BASED INFUSION
3.0000 mL/kg/h | INTRAVENOUS | Status: DC
  Administered 2015-04-04: 3 mL/kg/h via INTRAVENOUS

## 2015-04-04 MED ORDER — HEPARIN (PORCINE) IN NACL 2-0.9 UNIT/ML-% IJ SOLN
INTRAMUSCULAR | Status: AC
  Filled 2015-04-04: qty 1000

## 2015-04-04 SURGICAL SUPPLY — 11 items

## 2015-04-04 NOTE — Telephone Encounter (Signed)
-----   Message from Sueanne Margarita, MD sent at 03/31/2015  8:23 PM EDT ----- Normal LVF with mildly dilated aortic root, mld stiffness of LV and mildly enlarged RV - repeat echo in 1 year for aortic root dilatation

## 2015-04-04 NOTE — H&P (View-Only) (Signed)
Cardiology Clinic Note   Patient Name: Heidi Hoffman Date of Encounter: 04/01/2015  Primary Care Provider:  Dorothyann Peng, NP Primary Cardiologist:  T. Turner, MD  Patient Profile    An 82 year old ?with a several month history of exertional chest pain who presents for follow-up after recent abnormal stress testing.  Past Medical History    Past Medical History  Diagnosis Date  . Asthma   . GERD (gastroesophageal reflux disease)   . Hyperlipidemia   . Hypertensive heart disease   . Osteoporosis   . COPD (chronic obstructive pulmonary disease) ([place])   . Allergy   . Anemia   . Diverticulosis   . Tubular adenoma of colon 10/01  . Unstable angina ([place])     a. 03/2015 MV: mod size and intensity, partially reversible antsept, sept, apical defect, EF 78%.  . Diastolic dysfunction     a. 03/2015 Echo: EF 55-60%, no rwma, Gr1 DD.    Past Surgical History  Procedure Laterality Date  . Cardiac catheterization  2001    She denies  . Total hip arthroplasty  11/20/2011    Procedure: TOTAL HIP ARTHROPLASTY;  Surgeon: Tobi Bastos, MD;  Location: [facility];  Service: Orthopedics;  Laterality: Left;    Allergies  No Known Allergies  History of Present Illness    82 year old female with the above complex past medical history. She was recently evaluated by Dr. Radford Pax secondary to a several month history of exertional substernal chest pain and dyspnea. Patient has a history of asthma and also a long history of tobacco abuse, quitting approximately 12 years ago, and she thought her symptoms may have been related to asthma. She describes chest pain as tightness that occurs with relatively minimal activity, lasting 10-15 minutes, resolving with rest. Following her visit with Dr. Radford Pax, she underwent stress testing which revealed a moderate size and intensity, partially reversible anteroseptal, septal, and apical defect with an EF of 70%. Echocardiogram was also performed and showed an EF of  55-60% without regional wall motion abnormalities. Grade 1 diastolic dysfunction was noted. As result of these findings, patient was contacted so that we may arrange diagnostic catheterization. She has continued to have exertional chest pain since her last visit. She denies PND, orthopnea, dizziness, syncope, edema, or early satiety. Further, she has never had chest pain at rest.  Home Medications    Prior to Admission medications   Medication Sig Start Date End Date Taking? Authorizing Provider  albuterol (PROVENTIL) (2.5 MG/3ML) 0.083% nebulizer solution INHALE ONE VIAL VIA NEBULIZER EVERY SIX HOURS AS NEEDED for wheezing 02/25/14  Yes Bruce Kendall Flack, MD  Albuterol Sulfate (PROAIR RESPICLICK) 123XX123 (90 Base) MCG/ACT AEPB Inhale 1 puff into the lungs 3 (three) times daily as needed (wheezing/ shortness of breath).   Yes Historical Provider, MD  aspirin 81 MG tablet Take 81 mg by mouth daily.   Yes Historical Provider, MD  atorvastatin (LIPITOR) 10 MG tablet Take 1 tablet (10 mg total) by mouth daily. 11/22/14  Yes Dorothyann Peng, NP  Calcium-Vitamin D (CALTRATE 600 PLUS-VIT D PO) Take 1 tablet by mouth daily.    Yes Historical Provider, MD  Docusate Calcium (STOOL SOFTENER PO) Take 1 capsule by mouth as needed (constipation).    Yes Historical Provider, MD  Fluticasone-Salmeterol (ADVAIR) 250-50 MCG/DOSE AEPB Inhale 1 puff into the lungs 2 (two) times daily.   Yes Historical Provider, MD  lisinopril (PRINIVIL,ZESTRIL) 30 MG tablet Take 1 tablet (30 mg total) by mouth daily. 03/09/15  Yes Dorothyann Peng, NP  losartan-hydrochlorothiazide (HYZAAR) 100-25 MG tablet Take 1 tablet by mouth daily.   Yes Historical Provider, MD  Multiple Vitamins-Minerals (CENTRUM SILVER ULTRA WOMENS PO) Take 1 tablet by mouth daily.    Yes Historical Provider, MD  nitroGLYCERIN (NITROSTAT) 0.4 MG SL tablet Place 1 tablet (0.4 mg total) under the tongue every 5 (five) minutes as needed for chest pain. 04/01/15   Rogelia Mire, NP    Family History    Family History  Problem Relation Age of Onset  . Heart disease Mother   . Diabetes Sister   . Asthma Daughter   . Asthma Son   . Colon cancer Neg Hx   . Esophageal cancer Neg Hx   . Rectal cancer Neg Hx   . Stomach cancer Neg Hx   . Pancreatic cancer Neg Hx   . Hypertension Mother   . Stroke Sister   . Stroke Brother     Social History    Social History   Social History  . Marital Status: Single    Spouse Name: N/A  . Number of Children: N/A  . Years of Education: N/A   Occupational History  . Not on file.   Social History Main Topics  . Smoking status: Former Smoker -- 2.00 packs/day for 50 years    Types: Cigarettes    Quit date: 05/09/2000  . Smokeless tobacco: Never Used  . Alcohol Use: 0.6 oz/week    1 Glasses of wine per week     Comment: occ.  . Drug Use: No  . Sexual Activity: Not Currently   Other Topics Concern  . Not on file   Social History Narrative   -Patient has never been married.    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    - Likes to read and watch TV.             Review of Systems    General:  No chills, fever, night sweats or weight changes.  Cardiovascular:  Positive exertional chest pain, dyspnea on exertion, no edema, orthopnea, palpitations, paroxysmal nocturnal dyspnea. Dermatological: No rash, lesions/masses Respiratory: No cough, dyspnea Urologic: No hematuria, dysuria Abdominal:   No nausea, vomiting, diarrhea, bright red blood per rectum, melena, or hematemesis Neurologic:  No visual changes, wkns, changes in mental status. All other systems reviewed and are otherwise negative except as noted above.  Physical Exam    VS:  BP 130/80 mmHg  Pulse 72  Ht 5\' 5"  (1.651 m)  Wt 189 lb 3.2 oz (85.821 kg)  BMI 31.48 kg/m2 , BMI Body mass index is 31.48 kg/(m^2). GEN: Well nourished, well developed, in no acute  distress. HEENT: normal.Xanthelasma present. Neck: Supple, no JVD, carotid bruits, or masses. Cardiac: RRR, no murmurs, rubs, or gallops. No clubbing, cyanosis, edema.  Radials/DP/PT 2+ and equal bilaterally.  Respiratory:  Respirations regular and unlabored, clear to auscultation bilaterally. GI: Soft, nontender, nondistended, BS + x 4. MS: no deformity or atrophy. Skin: warm and dry, no rash. Neuro:  Strength and sensation are intact. Psych: Normal affect.  Accessory Clinical Findings    ECG - EKG from 03/07/2015 reviewed-regular sinus rhythm, 98, biatrial enlargement, nonspecific ST and T changes.  Assessment & Plan   1.  Unstable angina: Patient has a several month history of exertional chest pain and dyspnea and  recently underwent stress testing which was intermediate risk with suggestion of anteroseptal, septal, and apical  ischemia. She has continued to have intermittent exertional chest pain and dyspnea. We discussed the next appropriate steps including diagnostic catheterization. We covered details of the procedure at length. The patient understands that risks include but are not limited to stroke (1 in 1000), death (1 in 67), kidney failure [usually temporary] (1 in 500), bleeding (1 in 200), allergic reaction [possibly serious] (1 in 200), and agrees to proceed.  She is scheduled for diagnostic catheterization for Monday, March 27 at 7:30 AM with Dr. Martinique at [facility]. She remains on aspirin and statin therapy. I have given her prescription for sublingual nitroglycerin. CBC, basic metabolic profile, and INR today. She will also need a chest x-ray.   2. Hypertensive heart disease: Blood pressure stable today. I note that she is on both lisinopril and Hyzaar. These appear to be long-term medications. We will follow up with a basic metabolic panel today as part of her precatheterization labs.  3. Hyperlipidemia: She is on statin therapy. LDL was 153 in November 2016. This will need  some follow-up.    4. Diastolic dysfunction: Euvolemic on exam. Heart rate and blood pressure stable.  5. Disposition: Plan for diagnostic catheterization early next week with office follow-up in approximately 2 weeks.  Murray Hodgkins, NP 04/01/2015, 1:55 PM

## 2015-04-04 NOTE — Telephone Encounter (Signed)
ECHO ordered to be scheduled in 1 year. 

## 2015-04-04 NOTE — Interval H&P Note (Signed)
History and Physical Interval Note:  04/04/2015 7:34 AM  Heidi Hoffman  has presented today for surgery, with the diagnosis of Unstable Angina, Abnormal Stress Test  The various methods of treatment have been discussed with the patient and family. After consideration of risks, benefits and other options for treatment, the patient has consented to  Procedure(s): Left Heart Cath and Coronary Angiography (N/A) as a surgical intervention .  The patient's history has been reviewed, patient examined, no change in status, stable for surgery.  I have reviewed the patient's chart and labs.  Questions were answered to the patient's satisfaction.   Cath Lab Visit (complete for each Cath Lab visit)  Clinical Evaluation Leading to the Procedure:   ACS: Yes.    Non-ACS:    Anginal Classification: CCS III  Anti-ischemic medical therapy: No Therapy  Non-Invasive Test Results: Intermediate-risk stress test findings: cardiac mortality 1-3%/year  Prior CABG: No previous CABG        Heidi Salina [facility] 04/04/2015 7:35 AM

## 2015-04-04 NOTE — Discharge Instructions (Signed)
Radial Site Care °Refer to this sheet in the next few weeks. These instructions provide you with information about caring for yourself after your procedure. Your health care provider may also give you more specific instructions. Your treatment has been planned according to current medical practices, but problems sometimes occur. Call your health care provider if you have any problems or questions after your procedure. °WHAT TO EXPECT AFTER THE PROCEDURE °After your procedure, it is typical to have the following: °· Bruising at the radial site that usually fades within 1-2 weeks. °· Blood collecting in the tissue (hematoma) that may be painful to the touch. It should usually decrease in size and tenderness within 1-2 weeks. °HOME CARE INSTRUCTIONS °· Take medicines only as directed by your health care provider. °· You may shower 24-48 hours after the procedure or as directed by your health care provider. Remove the bandage (dressing) and gently wash the site with plain soap and water. Pat the area dry with a clean towel. Do not rub the site, because this may cause bleeding. °· Do not take baths, swim, or use a hot tub until your health care provider approves. °· Check your insertion site every day for redness, swelling, or drainage. °· Do not apply powder or lotion to the site. °· Do not flex or bend the affected arm for 24 hours or as directed by your health care provider. °· Do not push or pull heavy objects with the affected arm for 24 hours or as directed by your health care provider. °· Do not lift over 10 lb (4.5 kg) for 5 days after your procedure or as directed by your health care provider. °· Ask your health care provider when it is okay to: °¨ Return to work or school. °¨ Resume usual physical activities or sports. °¨ Resume sexual activity. °· Do not drive home if you are discharged the same day as the procedure. Have someone else drive you. °· You may drive 24 hours after the procedure unless otherwise  instructed by your health care provider. °· Do not operate machinery or power tools for 24 hours after the procedure. °· If your procedure was done as an outpatient procedure, which means that you went home the same day as your procedure, a responsible adult should be with you for the first 24 hours after you arrive home. °· Keep all follow-up visits as directed by your health care provider. This is important. °SEEK MEDICAL CARE IF: °· You have a fever. °· You have chills. °· You have increased bleeding from the radial site. Hold pressure on the site. °SEEK IMMEDIATE MEDICAL CARE IF: °· You have unusual pain at the radial site. °· You have redness, warmth, or swelling at the radial site. °· You have drainage (other than a small amount of blood on the dressing) from the radial site. °· The radial site is bleeding, and the bleeding does not stop after 30 minutes of holding steady pressure on the site. °· Your arm or hand becomes pale, cool, tingly, or numb. °  °This information is not intended to replace advice given to you by your health care provider. Make sure you discuss any questions you have with your health care provider. °  °Document Released: 01/27/2010 Document Revised: 01/15/2014 Document Reviewed: 07/13/2013 °Elsevier Interactive Patient Education ©2016 [employer] ° °

## 2015-04-04 NOTE — Telephone Encounter (Signed)
Daughter returned your call this am and would like a call when you get back. She is aware Tommi Rumps out until [employer].

## 2015-04-04 NOTE — Research (Signed)
[place] STUDY Informed Consent   Subject Name: Heidi Hoffman  Subject met inclusion and exclusion criteria.  The informed consent form, study requirements and expectations were reviewed with the subject and questions and concerns were addressed prior to the signing of the consent form.  The subject verbalized understanding of the trial requirements.  The subject agreed to participate in the [place] trial and signed the informed consent.  The informed consent was obtained prior to performance of any protocol-specific procedures for the subject.  A copy of the signed informed consent was given to the subject and a copy was placed in the subject's medical record.  [name] 04/04/2015, 07:05AM

## 2015-04-07 ENCOUNTER — Telehealth: Payer: Self-pay | Admitting: Adult Health

## 2015-04-07 NOTE — Telephone Encounter (Signed)
Left VM for daughter to call me back

## 2015-04-11 ENCOUNTER — Ambulatory Visit (HOSPITAL_COMMUNITY): Payer: Medicare Other

## 2015-04-19 ENCOUNTER — Encounter (HOSPITAL_COMMUNITY): Payer: Medicare Other

## 2015-04-23 NOTE — Progress Notes (Signed)
Cardiology Office Note    Date:  04/27/2015   ID:  Kimora Bolduc, DOB January 25, 1932, MRN [id number]  PCP:  Dorothyann Peng, NP  Cardiologist: Dr. Radford Pax   Follow up of cath   History of Present Illness:  Heidi Hoffman is a 82 y.o. female with a history of GERD, HTN, HLD, COPD, asthma and exertional chest pain s/p recent LHC with normal coronary anatomy who presents to clinic for follow up.  She was recently evaluated by Dr. Radford Pax secondary to a several month history of exertional substernal chest pain and dyspnea. Following her visit with Dr. Radford Pax, she underwent stress testing which revealed a moderate size and intensity, partially reversible anteroseptal, septal, and apical defect with an EF of 70%. Echocardiogram was also performed and showed an EF of 55-60% without regional wall motion abnormalities. Grade 1 diastolic dysfunction was noted.  She was set up for coronary angiography on 04/04/15 which showed normal coronary anatomy and normal LV function.  Today she presents to clinic for follow up. She has been having no more chest pain, but once in a while she gets some chest pain. She has some SOB every once and a while.  No LE edema, orthopnea or PND. No dizziness or syncope.    Past Medical History  Diagnosis Date  . Asthma   . GERD (gastroesophageal reflux disease)   . Hyperlipidemia   . Hypertensive heart disease   . Osteoporosis   . COPD (chronic obstructive pulmonary disease) ([place])   . Allergy   . Anemia   . Diverticulosis   . Tubular adenoma of colon 10/01  . Unstable angina ([place])     a. 03/2015 MV: mod size and intensity, partially reversible antsept, sept, apical defect, EF 78%.  . Diastolic dysfunction     a. 03/2015 Echo: EF 55-60%, no rwma, Gr1 DD.     Past Surgical History  Procedure Laterality Date  . Cardiac catheterization  2001    She denies  . Total hip arthroplasty  11/20/2011    Procedure: TOTAL HIP ARTHROPLASTY;  Surgeon: Tobi Bastos, MD;   Location: [facility];  Service: Orthopedics;  Laterality: Left;  . Cardiac catheterization N/A 04/04/2015    Procedure: Left Heart Cath and Coronary Angiography;  Surgeon: Peter M Martinique, MD;  Location: [facility];  Service: Cardiovascular;  Laterality: N/A;    Current Medications: Outpatient Prescriptions Prior to Visit  Medication Sig Dispense Refill  . acetaminophen (TYLENOL) 500 MG tablet Take 1,000 mg by mouth daily as needed (pain).    Marland Kitchen albuterol (PROVENTIL) (2.5 MG/3ML) 0.083% nebulizer solution INHALE ONE VIAL VIA NEBULIZER EVERY SIX HOURS AS NEEDED for wheezing (Patient taking differently: INHALE ONE VIAL VIA NEBULIZER EVERY SIX HOURS AS NEEDED FOR WHEEZING) 150 mL 0  . Albuterol Sulfate (PROAIR RESPICLICK) 123XX123 (90 Base) MCG/ACT AEPB Inhale 1 puff into the lungs 3 (three) times daily as needed (wheezing/ shortness of breath).    Marland Kitchen aspirin EC 81 MG tablet Take 81 mg by mouth daily.    Marland Kitchen atorvastatin (LIPITOR) 10 MG tablet Take 1 tablet (10 mg total) by mouth daily. 90 tablet 3  . docusate sodium (COLACE) 50 MG capsule Take 50 mg by mouth daily as needed for mild constipation.    . Fluticasone-Salmeterol (ADVAIR) 250-50 MCG/DOSE AEPB Inhale 1 puff into the lungs 2 (two) times daily.    . Iron TABS Take 1 tablet by mouth daily.    Marland Kitchen losartan-hydrochlorothiazide (HYZAAR) 100-25 MG tablet Take 1  tablet by mouth daily.    . nitroGLYCERIN (NITROSTAT) 0.4 MG SL tablet Place 1 tablet (0.4 mg total) under the tongue every 5 (five) minutes as needed for chest pain. 25 tablet 3   No facility-administered medications prior to visit.     Allergies:   Review of patient's allergies indicates no known allergies.   Social History   Social History  . Marital Status: Single    Spouse Name: N/A  . Number of Children: N/A  . Years of Education: N/A   Social History Main Topics  . Smoking status: Former Smoker -- 2.00 packs/day for 50 years    Types: Cigarettes    Quit date: 05/09/2000  .  Smokeless tobacco: Never Used  . Alcohol Use: 0.6 oz/week    1 Glasses of wine per week     Comment: occ.  . Drug Use: No  . Sexual Activity: Not Currently   Other Topics Concern  . None   Social History Narrative   -Patient has never been married.    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    - Likes to read and watch TV.             Family History:  The patient's family history includes Asthma in her daughter and son; Diabetes in her sister; Heart disease in her mother; Hypertension in her mother; Stroke in her brother and sister. There is no history of Colon cancer, Esophageal cancer, Rectal cancer, Stomach cancer, or Pancreatic cancer.   ROS:   Please see the history of present illness.    ROS All other systems reviewed and are negative.   PHYSICAL EXAM:   VS:  BP 142/70 mmHg  Pulse 64  Ht 5\' 5"  (1.651 m)  Wt 185 lb (83.915 kg)  BMI 30.79 kg/m2   GEN: Well nourished, well developed, in no acute distress HEENT: normal Neck: no JVD, carotid bruits, or masses Cardiac: RRR; no murmurs, rubs, or gallops,no edema  Respiratory:  clear to auscultation bilaterally, normal work of breathing GI: soft, nontender, nondistended, + BS MS: no deformity or atrophy Skin: warm and dry, no rash Neuro:  Alert and Oriented x 3, Strength and sensation are intact Psych: euthymic mood, full affect  Wt Readings from Last 3 Encounters:  04/27/15 185 lb (83.915 kg)  04/04/15 170 lb (77.111 kg)  04/01/15 189 lb 3.2 oz (85.821 kg)      Studies/Labs Reviewed:   EKG:  EKG is ordered today.  The ekg ordered today demonstrates sinus rhythm with market sinus arrhythmia. PVC. Heart rate 75.  Recent Labs: 11/15/2014: ALT 14; TSH 3.87 04/01/2015: BUN 17; Creat 1.11*; Hemoglobin 10.3*; Platelets 404*; Potassium 4.6; Sodium 137   Lipid Panel    Component Value Date/Time   CHOL 236* 11/15/2014 1049   TRIG 149.0  11/15/2014 1049   HDL 53.10 11/15/2014 1049   CHOLHDL 4 11/15/2014 1049   VLDL 29.8 11/15/2014 1049   LDLCALC 153* 11/15/2014 1049   LDLDIRECT 167.1 05/28/2012 1019    Additional studies/ records that were reviewed today include:  MPI: 03/28/15    Nuclear stress EF: 78%.  No T wave inversion was noted during stress.  There was no ST segment deviation noted during stress.  Defect 1: There is a medium defect of moderate severity.  Findings consistent with ischemia.  This is an  intermediate risk study.  Moderate size and intensity, partially reversible (SDS 5) anteroseptal, septal and apical perfusion defect suggestive of ischemia or less likely shifting breast artifact. LVEF 78% with normal wall motion. This is an intermediate risk study. Clinical correlation is advised.    LHC: 04/01/15 Conclusion     The left ventricular systolic function is normal.  1. Normal coronary anatomy 2. Normal LV function       ASSESSMENT:    1. Chest pain, unspecified chest pain type   2. Essential hypertension   3. HLD (hyperlipidemia)   4. Diastolic dysfunction      PLAN:  In order of problems listed above:  Chest pain: consider other causes of chest pain with normal coronary anatomy on recent heart catheterization. Will trial her protonix 40mg  daily to see if this helps.   HTN: Blood pressure stable today. I note that she is on both lisinopril and Hyzaar. These appear to be long-term medications. We will follow up with a basic metabolic panel today as part of her precatheterization labs.  HLD: She is on statin therapy. LDL was 153 in November 2016. This will need some follow-up.   Diastolic dysfunction: Euvolemic on exam. Heart rate and blood pressure stable.  COPD: continue to follow with Dr. Vaughan Browner. She has not been taking the Advair and has been wheezing.  Medication Adjustments/Labs and Tests Ordered: Current medicines are reviewed at length with the patient today.   Concerns regarding medicines are outlined above.  Medication changes, Labs and Tests ordered today are listed in the Patient Instructions below. Patient Instructions  Medication Instructions:  Your physician has recommended you make the following change in your medication:  1.  START Protonix 40 mg taking 1 tablet at night time.   Labwork: None ordered  Testing/Procedures: None ordered  Follow-Up: Your physician wants you to follow-up in: [place] DR. Mallie Snooks will receive a reminder letter in the mail two months in advance. If you don't receive a letter, please call our office to schedule the follow-up appointment.    Any Other Special Instructions Will Be Listed Below (If Applicable).   If you need a refill on your cardiac medications before your next appointment, please call your pharmacy.       Renea Ee  04/27/2015 10:11 AM    [address], [place], [place]  [address] Phone: [phone]; Fax: [phone]

## 2015-04-25 ENCOUNTER — Encounter (HOSPITAL_COMMUNITY): Payer: Medicare Other

## 2015-04-27 ENCOUNTER — Encounter: Payer: Self-pay | Admitting: Physician Assistant

## 2015-04-27 ENCOUNTER — Ambulatory Visit (INDEPENDENT_AMBULATORY_CARE_PROVIDER_SITE_OTHER): Payer: Medicare Other | Admitting: Physician Assistant

## 2015-04-27 VITALS — BP 142/70 | HR 64 | Ht 65.0 in | Wt 185.0 lb

## 2015-04-27 DIAGNOSIS — I519 Heart disease, unspecified: Secondary | ICD-10-CM

## 2015-04-27 DIAGNOSIS — I10 Essential (primary) hypertension: Secondary | ICD-10-CM

## 2015-04-27 DIAGNOSIS — I200 Unstable angina: Secondary | ICD-10-CM

## 2015-04-27 DIAGNOSIS — E785 Hyperlipidemia, unspecified: Secondary | ICD-10-CM

## 2015-04-27 DIAGNOSIS — R079 Chest pain, unspecified: Secondary | ICD-10-CM

## 2015-04-27 DIAGNOSIS — I5189 Other ill-defined heart diseases: Secondary | ICD-10-CM

## 2015-04-27 MED ORDER — PANTOPRAZOLE SODIUM 40 MG PO TBEC: 40.0000 mg | DELAYED_RELEASE_TABLET | Freq: Every day | ORAL | Status: DC

## 2015-04-27 NOTE — Patient Instructions (Signed)
Medication Instructions:  Your physician has recommended you make the following change in your medication:  1.  START Protonix 40 mg taking 1 tablet at night time.   Labwork: None ordered  Testing/Procedures: None ordered  Follow-Up: Your physician wants you to follow-up in: [place] DR. Mallie Snooks will receive a reminder letter in the mail two months in advance. If you don't receive a letter, please call our office to schedule the follow-up appointment.    Any Other Special Instructions Will Be Listed Below (If Applicable).   If you need a refill on your cardiac medications before your next appointment, please call your pharmacy.

## 2015-05-26 DIAGNOSIS — I10 Essential (primary) hypertension: Secondary | ICD-10-CM | POA: Diagnosis not present

## 2015-05-26 DIAGNOSIS — D649 Anemia, unspecified: Secondary | ICD-10-CM | POA: Diagnosis not present

## 2015-05-26 DIAGNOSIS — N183 Chronic kidney disease, stage 3 (moderate): Secondary | ICD-10-CM | POA: Diagnosis not present

## 2015-05-26 DIAGNOSIS — J4530 Mild persistent asthma, uncomplicated: Secondary | ICD-10-CM | POA: Diagnosis not present

## 2015-06-03 DIAGNOSIS — R7309 Other abnormal glucose: Secondary | ICD-10-CM | POA: Diagnosis not present

## 2015-06-03 DIAGNOSIS — I10 Essential (primary) hypertension: Secondary | ICD-10-CM | POA: Diagnosis not present

## 2015-06-03 DIAGNOSIS — H903 Sensorineural hearing loss, bilateral: Secondary | ICD-10-CM | POA: Diagnosis not present

## 2015-06-03 DIAGNOSIS — E210 Primary hyperparathyroidism: Secondary | ICD-10-CM | POA: Diagnosis not present

## 2015-06-03 DIAGNOSIS — E785 Hyperlipidemia, unspecified: Secondary | ICD-10-CM | POA: Diagnosis not present

## 2015-06-08 DIAGNOSIS — Z13820 Encounter for screening for osteoporosis: Secondary | ICD-10-CM | POA: Diagnosis not present

## 2015-06-08 DIAGNOSIS — E210 Primary hyperparathyroidism: Secondary | ICD-10-CM | POA: Diagnosis not present

## 2015-06-15 DIAGNOSIS — E210 Primary hyperparathyroidism: Secondary | ICD-10-CM | POA: Diagnosis not present

## 2015-06-20 ENCOUNTER — Ambulatory Visit: Payer: Medicare Other | Admitting: Pulmonary Disease

## 2015-06-20 DIAGNOSIS — M8580 Other specified disorders of bone density and structure, unspecified site: Secondary | ICD-10-CM | POA: Diagnosis not present

## 2015-06-20 DIAGNOSIS — E210 Primary hyperparathyroidism: Secondary | ICD-10-CM | POA: Diagnosis not present

## 2015-08-22 DIAGNOSIS — Z1231 Encounter for screening mammogram for malignant neoplasm of breast: Secondary | ICD-10-CM | POA: Diagnosis not present

## 2015-09-19 DIAGNOSIS — M25551 Pain in right hip: Secondary | ICD-10-CM | POA: Diagnosis not present

## 2015-09-19 DIAGNOSIS — M5441 Lumbago with sciatica, right side: Secondary | ICD-10-CM | POA: Diagnosis not present

## 2015-10-24 ENCOUNTER — Ambulatory Visit: Payer: BLUE CROSS/BLUE SHIELD | Admitting: Cardiology

## 2015-10-24 ENCOUNTER — Encounter: Payer: Self-pay | Admitting: Nurse Practitioner

## 2015-10-24 ENCOUNTER — Ambulatory Visit (INDEPENDENT_AMBULATORY_CARE_PROVIDER_SITE_OTHER): Payer: Medicare Other | Admitting: Nurse Practitioner

## 2015-10-24 ENCOUNTER — Encounter (INDEPENDENT_AMBULATORY_CARE_PROVIDER_SITE_OTHER): Payer: Self-pay

## 2015-10-24 VITALS — BP 120/70 | HR 67 | Ht 65.0 in | Wt 187.1 lb

## 2015-10-24 DIAGNOSIS — I10 Essential (primary) hypertension: Secondary | ICD-10-CM | POA: Diagnosis not present

## 2015-10-24 DIAGNOSIS — E7800 Pure hypercholesterolemia, unspecified: Secondary | ICD-10-CM | POA: Diagnosis not present

## 2015-10-24 DIAGNOSIS — I200 Unstable angina: Secondary | ICD-10-CM

## 2015-10-24 DIAGNOSIS — I519 Heart disease, unspecified: Secondary | ICD-10-CM | POA: Diagnosis not present

## 2015-10-24 DIAGNOSIS — I5189 Other ill-defined heart diseases: Secondary | ICD-10-CM

## 2015-10-24 NOTE — Patient Instructions (Addendum)
We will be checking the following labs today - NONE   Medication Instructions:    Continue with your current medicines.     Testing/Procedures To Be Arranged:  N/A  Follow-Up:   See Dr. Radford Pax in one year.     Other Special Instructions:   Try to walk a little more.     If you need a refill on your cardiac medications before your next appointment, please call your pharmacy.   Call the [place] office at [phone] if you have any questions, problems or concerns.

## 2015-10-24 NOTE — Progress Notes (Signed)
CARDIOLOGY OFFICE NOTE  Date:  10/24/2015    Heidi Hoffman Date of Birth: [birth year] Medical Record [id number]  PCP:  Dorothyann Peng, NP  Cardiologist:  Radford Pax  Chief Complaint  Patient presents with  . Hypertension    4 month check - seen for Dr. Radford Pax    History of Present Illness: Heidi Hoffman is a 82 y.o. female who presents today for a 4 month check. Seen for Dr. Radford Pax.   She has a history of GERD, HTN, HLD, COPD, asthma and exertional chest pain s/p  LHC with normal coronary anatomy noted back in April of 2017. Myoview stress testing from March of 2017 revealed a moderate size and intensity, partially reversible anteroseptal, septal, and apical defect with an EF of 70%. Echocardiogram was also performed and showed an EF of 55-60% without regional wall motion abnormalities. Grade 1 diastolic dysfunction was noted.  Last seen by Bonney Leitz, PA - post cath visit - was felt to be doing well. Normal cath findings were reassuring.   Comes in today. Here alone. No more chest pain. Feels pretty good. Trying to remain active. Tolerating her medicines without issue. Labs are checked by PCP.   Past Medical History:  Diagnosis Date  . Allergy   . Anemia   . Asthma   . COPD (chronic obstructive pulmonary disease) ([place])   . Diastolic dysfunction    a. 03/2015 Echo: EF 55-60%, no rwma, Gr1 DD.   Marland Kitchen Diverticulosis   . GERD (gastroesophageal reflux disease)   . Hyperlipidemia   . Hypertensive heart disease   . Osteoporosis   . Tubular adenoma of colon 10/01  . Unstable angina ([place])    a. 03/2015 MV: mod size and intensity, partially reversible antsept, sept, apical defect, EF 78%.    Past Surgical History:  Procedure Laterality Date  . CARDIAC CATHETERIZATION  2001   She denies  . CARDIAC CATHETERIZATION N/A 04/04/2015   Procedure: Left Heart Cath and Coronary Angiography;  Surgeon: Peter M Martinique, MD;  Location: [facility];  Service: Cardiovascular;   Laterality: N/A;  . TOTAL HIP ARTHROPLASTY  11/20/2011   Procedure: TOTAL HIP ARTHROPLASTY;  Surgeon: Tobi Bastos, MD;  Location: [facility];  Service: Orthopedics;  Laterality: Left;     Medications: Current Outpatient Prescriptions  Medication Sig Dispense Refill  . acetaminophen (TYLENOL) 500 MG tablet Take 1,000 mg by mouth daily as needed (pain).    Marland Kitchen albuterol (PROVENTIL) (2.5 MG/3ML) 0.083% nebulizer solution INHALE ONE VIAL VIA NEBULIZER EVERY SIX HOURS AS NEEDED for wheezing (Patient taking differently: INHALE ONE VIAL VIA NEBULIZER EVERY SIX HOURS AS NEEDED FOR WHEEZING) 150 mL 0  . Albuterol Sulfate (PROAIR RESPICLICK) 123XX123 (90 Base) MCG/ACT AEPB Inhale 1 puff into the lungs 3 (three) times daily as needed (wheezing/ shortness of breath).    Marland Kitchen aspirin EC 81 MG tablet Take 81 mg by mouth daily.    Marland Kitchen atorvastatin (LIPITOR) 10 MG tablet Take 1 tablet (10 mg total) by mouth daily. 90 tablet 3  . Fluticasone-Salmeterol (ADVAIR) 250-50 MCG/DOSE AEPB Inhale 1 puff into the lungs 2 (two) times daily.    . Iron TABS Take 1 tablet by mouth daily.    Marland Kitchen losartan-hydrochlorothiazide (HYZAAR) 100-25 MG tablet Take 1 tablet by mouth daily.    . nitroGLYCERIN (NITROSTAT) 0.4 MG SL tablet Place 1 tablet (0.4 mg total) under the tongue every 5 (five) minutes as needed for chest pain. 25 tablet 3  . pantoprazole (  PROTONIX) 40 MG tablet Take 1 tablet (40 mg total) by mouth at bedtime. 90 tablet 3   No current facility-administered medications for this visit.     Allergies: No Known Allergies  Social History: The patient  reports that she quit smoking about 15 years ago. Her smoking use included Cigarettes. She has a 100.00 pack-year smoking history. She has never used smokeless tobacco. She reports that she drinks about 0.6 oz of alcohol per week . She reports that she does not use drugs.   Family History: The patient's family history includes Asthma in her daughter and son; Diabetes in her  sister; Heart disease in her mother; Hypertension in her mother; Stroke in her brother and sister.   Review of Systems: Please see the history of present illness.   Otherwise, the review of systems is positive for none.   All other systems are reviewed and negative.   Physical Exam: VS:  BP 120/70   Pulse 67   Ht 5\' 5"  (1.651 m)   Wt 187 lb 1.9 oz (84.9 kg)   SpO2 96% Comment: at rest  BMI 31.14 kg/m  .  BMI Body mass index is 31.14 kg/m.  Wt Readings from Last 3 Encounters:  10/24/15 187 lb 1.9 oz (84.9 kg)  04/27/15 185 lb (83.9 kg)  04/04/15 170 lb (77.1 kg)    General: Pleasant. Well developed, well nourished and in no acute distress. She looks younger than her stated age. She has gained weight.   HEENT: Normal.  Neck: Supple, no JVD, carotid bruits, or masses noted.  Cardiac: Regular rate and rhythm. Heart tones are distant. No edema.  Respiratory:  Lungs are clear to auscultation bilaterally with normal work of breathing.  GI: Soft and nontender.  MS: No deformity or atrophy. Gait and ROM intact.  Skin: Warm and dry. Color is normal.  Neuro:  Strength and sensation are intact and no gross focal deficits noted.  Psych: Alert, appropriate and with normal affect.   LABORATORY DATA:  EKG:  EKG is not ordered today.  Lab Results  Component Value Date   WBC 7.9 04/01/2015   HGB 10.3 (L) 04/01/2015   HCT 30.8 (L) 04/01/2015   PLT 404 (H) 04/01/2015   GLUCOSE 81 04/01/2015   CHOL 236 (H) 11/15/2014   TRIG 149.0 11/15/2014   HDL 53.10 11/15/2014   LDLDIRECT 167.1 05/28/2012   LDLCALC 153 (H) 11/15/2014   ALT 14 11/15/2014   AST 16 11/15/2014   NA 137 04/01/2015   K 4.6 04/01/2015   CL 102 04/01/2015   CREATININE 1.11 (H) 04/01/2015   BUN 17 04/01/2015   CO2 22 04/01/2015   TSH 3.87 11/15/2014   INR 0.92 04/01/2015    BNP (last 3 results) No results for input(s): BNP in the last 8760 hours.  ProBNP (last 3 results) No results for input(s): PROBNP in the  last 8760 hours.   Other Studies Reviewed Today: MPI: 03/28/15    Nuclear stress EF: 78%.  No T wave inversion was noted during stress.  There was no ST segment deviation noted during stress.  Defect 1: There is a medium defect of moderate severity.  Findings consistent with ischemia.  This is an intermediate risk study.  Moderate size and intensity, partially reversible (SDS 5) anteroseptal, septal and apical perfusion defect suggestive of ischemia or less likely shifting breast artifact. LVEF 78% with normal wall motion. This is an intermediate risk study. Clinical correlation is advised.    LHC:  04/01/15    Conclusion     The left ventricular systolic function is normal.  1. Normal coronary anatomy 2. Normal LV function      Assessment/Plan:  Chest pain: normal coronary anatomy on recent heart catheterization. Her symptoms have resolved.   HTN: Blood pressure stable today. No changes with her current regimen.   HLD: She is on statin therapy. Her labs are checked by PCP  Diastolic dysfunction: Euvolemic on exam. Heart rate and blood pressure stable.  COPD: she sees pulmonary.    Current medicines are reviewed with the patient today.  The patient does not have concerns regarding medicines other than what has been noted above.  The following changes have been made:  See above.  Labs/ tests ordered today include:   No orders of the defined types were placed in this encounter.    Disposition:   FU with Dr. Radford Pax in 12 months.   Patient is agreeable to this plan and will call if any problems develop in the interim.   Signed: Burtis Junes, RN, ANP-C 10/24/2015 2:57 PM  [facility] [address] [place], [place]  [address] Phone: [phone] Fax: [phone]

## 2015-11-06 ENCOUNTER — Other Ambulatory Visit: Payer: Self-pay | Admitting: Adult Health

## 2015-11-06 DIAGNOSIS — E785 Hyperlipidemia, unspecified: Secondary | ICD-10-CM

## 2015-11-14 DIAGNOSIS — Z23 Encounter for immunization: Secondary | ICD-10-CM | POA: Diagnosis not present

## 2016-03-23 ENCOUNTER — Telehealth (HOSPITAL_COMMUNITY): Payer: Self-pay | Admitting: Radiology

## 2016-03-23 NOTE — Telephone Encounter (Signed)
Called to schedule echocardiogram 

## 2016-05-05 ENCOUNTER — Other Ambulatory Visit: Payer: Self-pay | Admitting: Adult Health

## 2016-05-05 DIAGNOSIS — E785 Hyperlipidemia, unspecified: Secondary | ICD-10-CM

## 2016-06-02 ENCOUNTER — Other Ambulatory Visit: Payer: Self-pay | Admitting: Physician Assistant

## 2016-06-12 DIAGNOSIS — Z1389 Encounter for screening for other disorder: Secondary | ICD-10-CM | POA: Diagnosis not present

## 2016-06-12 DIAGNOSIS — I10 Essential (primary) hypertension: Secondary | ICD-10-CM | POA: Diagnosis not present

## 2016-06-12 DIAGNOSIS — Z139 Encounter for screening, unspecified: Secondary | ICD-10-CM | POA: Diagnosis not present

## 2016-06-12 DIAGNOSIS — R05 Cough: Secondary | ICD-10-CM | POA: Diagnosis not present

## 2016-06-12 DIAGNOSIS — M545 Low back pain: Secondary | ICD-10-CM | POA: Diagnosis not present

## 2016-06-12 DIAGNOSIS — M5136 Other intervertebral disc degeneration, lumbar region: Secondary | ICD-10-CM | POA: Diagnosis not present

## 2016-06-12 DIAGNOSIS — Z0000 Encounter for general adult medical examination without abnormal findings: Secondary | ICD-10-CM | POA: Diagnosis not present

## 2016-06-12 DIAGNOSIS — J4521 Mild intermittent asthma with (acute) exacerbation: Secondary | ICD-10-CM | POA: Diagnosis not present

## 2016-06-12 DIAGNOSIS — G8929 Other chronic pain: Secondary | ICD-10-CM | POA: Diagnosis not present

## 2016-06-12 DIAGNOSIS — M47816 Spondylosis without myelopathy or radiculopathy, lumbar region: Secondary | ICD-10-CM | POA: Diagnosis not present

## 2016-06-12 DIAGNOSIS — E210 Primary hyperparathyroidism: Secondary | ICD-10-CM | POA: Diagnosis not present

## 2016-06-12 DIAGNOSIS — R7303 Prediabetes: Secondary | ICD-10-CM | POA: Diagnosis not present

## 2016-06-12 DIAGNOSIS — E785 Hyperlipidemia, unspecified: Secondary | ICD-10-CM | POA: Diagnosis not present

## 2016-06-12 DIAGNOSIS — R413 Other amnesia: Secondary | ICD-10-CM | POA: Diagnosis not present

## 2016-06-13 DIAGNOSIS — Z0000 Encounter for general adult medical examination without abnormal findings: Secondary | ICD-10-CM | POA: Diagnosis not present

## 2016-06-13 DIAGNOSIS — I10 Essential (primary) hypertension: Secondary | ICD-10-CM | POA: Diagnosis not present

## 2016-06-13 DIAGNOSIS — R7303 Prediabetes: Secondary | ICD-10-CM | POA: Diagnosis not present

## 2016-07-23 DIAGNOSIS — G309 Alzheimer's disease, unspecified: Secondary | ICD-10-CM | POA: Diagnosis not present

## 2016-07-23 DIAGNOSIS — G450 Vertebro-basilar artery syndrome: Secondary | ICD-10-CM | POA: Diagnosis not present

## 2016-07-23 DIAGNOSIS — F0390 Unspecified dementia without behavioral disturbance: Secondary | ICD-10-CM | POA: Diagnosis not present

## 2016-07-23 DIAGNOSIS — R4182 Altered mental status, unspecified: Secondary | ICD-10-CM | POA: Diagnosis not present

## 2016-07-23 DIAGNOSIS — R4789 Other speech disturbances: Secondary | ICD-10-CM | POA: Diagnosis not present

## 2016-07-23 DIAGNOSIS — R4781 Slurred speech: Secondary | ICD-10-CM | POA: Diagnosis not present

## 2016-07-23 DIAGNOSIS — F0150 Vascular dementia without behavioral disturbance: Secondary | ICD-10-CM | POA: Diagnosis not present

## 2016-07-23 DIAGNOSIS — G3184 Mild cognitive impairment, so stated: Secondary | ICD-10-CM | POA: Diagnosis not present

## 2016-07-23 DIAGNOSIS — R0989 Other specified symptoms and signs involving the circulatory and respiratory systems: Secondary | ICD-10-CM | POA: Diagnosis not present

## 2016-07-26 DIAGNOSIS — H8113 Benign paroxysmal vertigo, bilateral: Secondary | ICD-10-CM | POA: Diagnosis not present

## 2016-07-26 DIAGNOSIS — R0989 Other specified symptoms and signs involving the circulatory and respiratory systems: Secondary | ICD-10-CM | POA: Diagnosis not present

## 2016-07-26 DIAGNOSIS — G450 Vertebro-basilar artery syndrome: Secondary | ICD-10-CM | POA: Diagnosis not present

## 2016-08-03 DIAGNOSIS — D519 Vitamin B12 deficiency anemia, unspecified: Secondary | ICD-10-CM | POA: Diagnosis not present

## 2016-08-06 DIAGNOSIS — H8113 Benign paroxysmal vertigo, bilateral: Secondary | ICD-10-CM | POA: Diagnosis not present

## 2016-08-06 DIAGNOSIS — G450 Vertebro-basilar artery syndrome: Secondary | ICD-10-CM | POA: Diagnosis not present

## 2016-08-06 DIAGNOSIS — F0150 Vascular dementia without behavioral disturbance: Secondary | ICD-10-CM | POA: Diagnosis not present

## 2016-08-06 DIAGNOSIS — R4781 Slurred speech: Secondary | ICD-10-CM | POA: Diagnosis not present

## 2016-08-06 DIAGNOSIS — R0989 Other specified symptoms and signs involving the circulatory and respiratory systems: Secondary | ICD-10-CM | POA: Diagnosis not present

## 2016-08-06 DIAGNOSIS — G3184 Mild cognitive impairment, so stated: Secondary | ICD-10-CM | POA: Diagnosis not present

## 2016-08-06 DIAGNOSIS — F0390 Unspecified dementia without behavioral disturbance: Secondary | ICD-10-CM | POA: Diagnosis not present

## 2016-08-06 DIAGNOSIS — G309 Alzheimer's disease, unspecified: Secondary | ICD-10-CM | POA: Diagnosis not present

## 2016-08-06 DIAGNOSIS — R4182 Altered mental status, unspecified: Secondary | ICD-10-CM | POA: Diagnosis not present

## 2016-08-06 DIAGNOSIS — R4789 Other speech disturbances: Secondary | ICD-10-CM | POA: Diagnosis not present

## 2016-08-06 DIAGNOSIS — I951 Orthostatic hypotension: Secondary | ICD-10-CM | POA: Diagnosis not present

## 2016-08-15 DIAGNOSIS — R51 Headache: Secondary | ICD-10-CM | POA: Diagnosis not present

## 2016-08-15 DIAGNOSIS — I6522 Occlusion and stenosis of left carotid artery: Secondary | ICD-10-CM | POA: Diagnosis not present

## 2016-08-15 DIAGNOSIS — M5481 Occipital neuralgia: Secondary | ICD-10-CM | POA: Diagnosis not present

## 2016-08-15 DIAGNOSIS — R42 Dizziness and giddiness: Secondary | ICD-10-CM | POA: Diagnosis not present

## 2016-08-15 DIAGNOSIS — I672 Cerebral atherosclerosis: Secondary | ICD-10-CM | POA: Diagnosis not present

## 2016-08-15 DIAGNOSIS — R413 Other amnesia: Secondary | ICD-10-CM | POA: Diagnosis not present

## 2016-08-15 DIAGNOSIS — D320 Benign neoplasm of cerebral meninges: Secondary | ICD-10-CM | POA: Diagnosis not present

## 2016-08-15 DIAGNOSIS — R9082 White matter disease, unspecified: Secondary | ICD-10-CM | POA: Diagnosis not present

## 2016-08-15 DIAGNOSIS — R55 Syncope and collapse: Secondary | ICD-10-CM | POA: Diagnosis not present

## 2016-09-11 DIAGNOSIS — G451 Carotid artery syndrome (hemispheric): Secondary | ICD-10-CM | POA: Diagnosis not present

## 2016-09-11 DIAGNOSIS — N183 Chronic kidney disease, stage 3 (moderate): Secondary | ICD-10-CM | POA: Diagnosis not present

## 2016-09-11 DIAGNOSIS — Z139 Encounter for screening, unspecified: Secondary | ICD-10-CM | POA: Diagnosis not present

## 2016-09-11 DIAGNOSIS — H8113 Benign paroxysmal vertigo, bilateral: Secondary | ICD-10-CM | POA: Diagnosis not present

## 2016-09-11 DIAGNOSIS — G309 Alzheimer's disease, unspecified: Secondary | ICD-10-CM | POA: Diagnosis not present

## 2016-09-11 DIAGNOSIS — E785 Hyperlipidemia, unspecified: Secondary | ICD-10-CM | POA: Diagnosis not present

## 2016-09-11 DIAGNOSIS — I10 Essential (primary) hypertension: Secondary | ICD-10-CM | POA: Diagnosis not present

## 2016-09-11 DIAGNOSIS — R4182 Altered mental status, unspecified: Secondary | ICD-10-CM | POA: Diagnosis not present

## 2016-09-11 DIAGNOSIS — R946 Abnormal results of thyroid function studies: Secondary | ICD-10-CM | POA: Diagnosis not present

## 2016-09-11 DIAGNOSIS — D631 Anemia in chronic kidney disease: Secondary | ICD-10-CM | POA: Diagnosis not present

## 2016-09-11 DIAGNOSIS — Z23 Encounter for immunization: Secondary | ICD-10-CM | POA: Diagnosis not present

## 2016-09-11 DIAGNOSIS — G3184 Mild cognitive impairment, so stated: Secondary | ICD-10-CM | POA: Diagnosis not present

## 2016-09-11 DIAGNOSIS — Z87891 Personal history of nicotine dependence: Secondary | ICD-10-CM | POA: Diagnosis not present

## 2016-09-11 DIAGNOSIS — L821 Other seborrheic keratosis: Secondary | ICD-10-CM | POA: Diagnosis not present

## 2016-09-11 DIAGNOSIS — I951 Orthostatic hypotension: Secondary | ICD-10-CM | POA: Diagnosis not present

## 2016-09-11 DIAGNOSIS — R490 Dysphonia: Secondary | ICD-10-CM | POA: Diagnosis not present

## 2016-09-11 DIAGNOSIS — R0989 Other specified symptoms and signs involving the circulatory and respiratory systems: Secondary | ICD-10-CM | POA: Diagnosis not present

## 2016-09-11 DIAGNOSIS — F0390 Unspecified dementia without behavioral disturbance: Secondary | ICD-10-CM | POA: Diagnosis not present

## 2016-09-11 DIAGNOSIS — J4521 Mild intermittent asthma with (acute) exacerbation: Secondary | ICD-10-CM | POA: Diagnosis not present

## 2016-09-11 DIAGNOSIS — R4789 Other speech disturbances: Secondary | ICD-10-CM | POA: Diagnosis not present

## 2016-09-11 DIAGNOSIS — E213 Hyperparathyroidism, unspecified: Secondary | ICD-10-CM | POA: Diagnosis not present

## 2016-09-24 DIAGNOSIS — E785 Hyperlipidemia, unspecified: Secondary | ICD-10-CM | POA: Diagnosis not present

## 2016-09-24 DIAGNOSIS — R946 Abnormal results of thyroid function studies: Secondary | ICD-10-CM | POA: Diagnosis not present

## 2016-09-24 DIAGNOSIS — I10 Essential (primary) hypertension: Secondary | ICD-10-CM | POA: Diagnosis not present

## 2016-09-24 DIAGNOSIS — J449 Chronic obstructive pulmonary disease, unspecified: Secondary | ICD-10-CM | POA: Diagnosis not present

## 2016-10-16 DIAGNOSIS — N183 Chronic kidney disease, stage 3 (moderate): Secondary | ICD-10-CM | POA: Diagnosis not present

## 2016-10-16 DIAGNOSIS — D631 Anemia in chronic kidney disease: Secondary | ICD-10-CM | POA: Diagnosis not present

## 2016-10-16 DIAGNOSIS — J4541 Moderate persistent asthma with (acute) exacerbation: Secondary | ICD-10-CM | POA: Diagnosis not present

## 2016-10-16 DIAGNOSIS — J4521 Mild intermittent asthma with (acute) exacerbation: Secondary | ICD-10-CM | POA: Diagnosis not present

## 2016-11-27 IMAGING — CT CT CHEST HIGH RESOLUTION W/O CM
2 of 5 series · 15 of 36 positions shown, 18 images · non-contrast
Comparison: 06/26/2006.

CLINICAL DATA: Worsening shortness of breath with exertion. Asthma.

EXAM:
CT CHEST WITHOUT CONTRAST
TECHNIQUE: Multidetector CT imaging of the chest was performed following the
standard protocol without intravenous contrast. High resolution
imaging of the lungs, as well as inspiratory and expiratory imaging,
was performed.

[Series 4: high resolution · axial · 0.62mm/px · z∈[-280,-35]mm · 12 of 57 slices shown, 15 images]
[im 4/57  mediastinal]
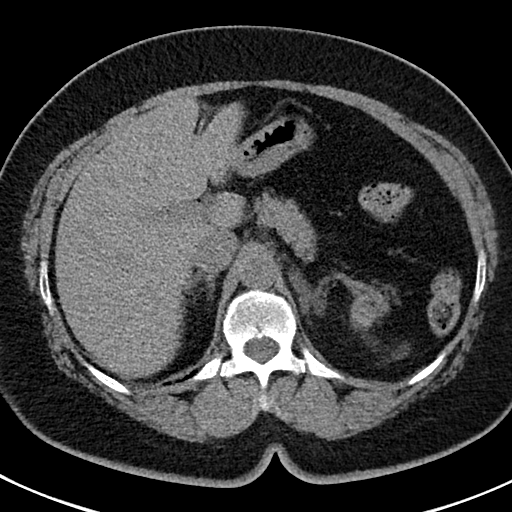
[im 4/57  lung]
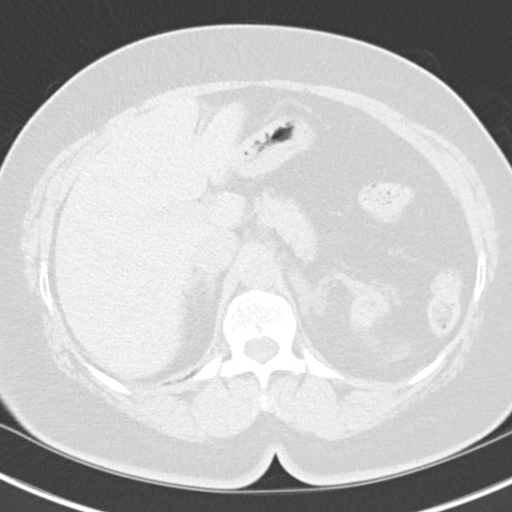
[im 10/57  lung]
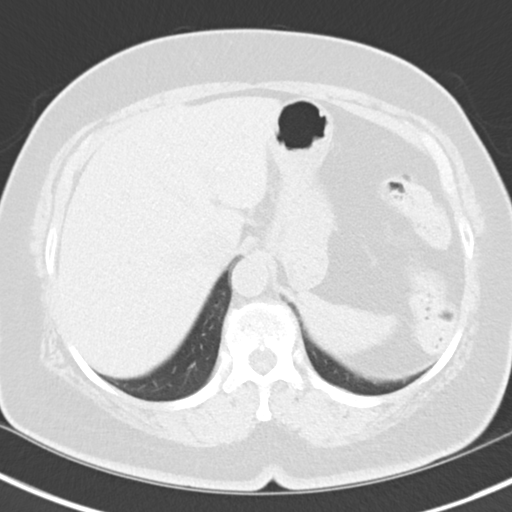
[im 14/57  lung]
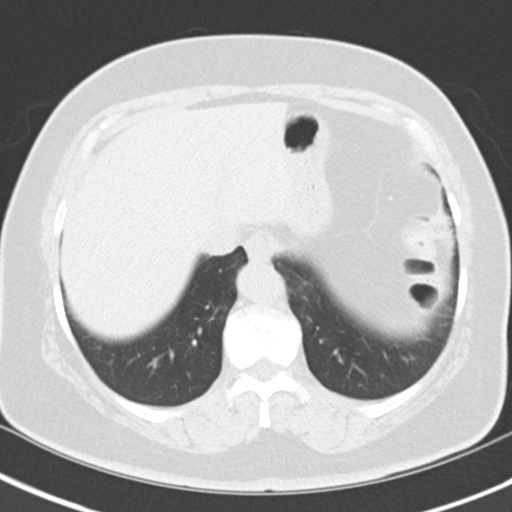
[im 17/57  lung]
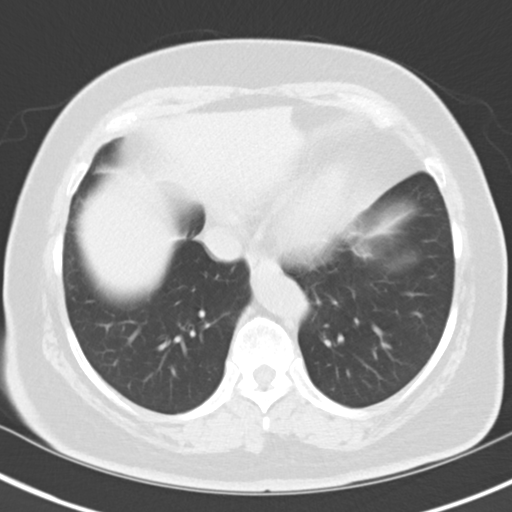
[im 24/57  mediastinal]
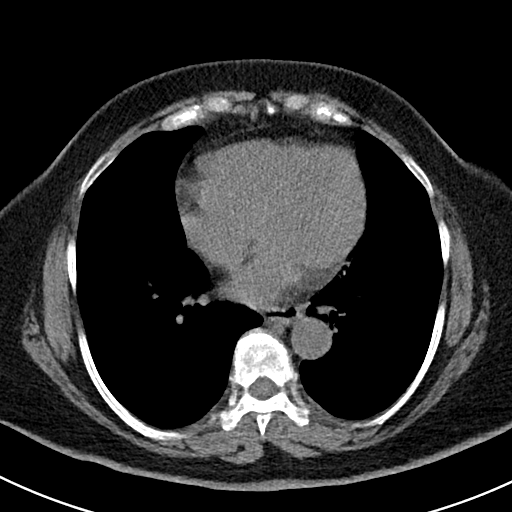
[im 24/57  lung]
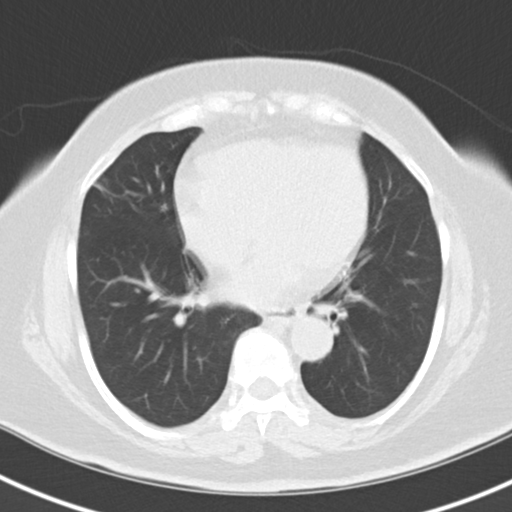
[im 27/57  lung]
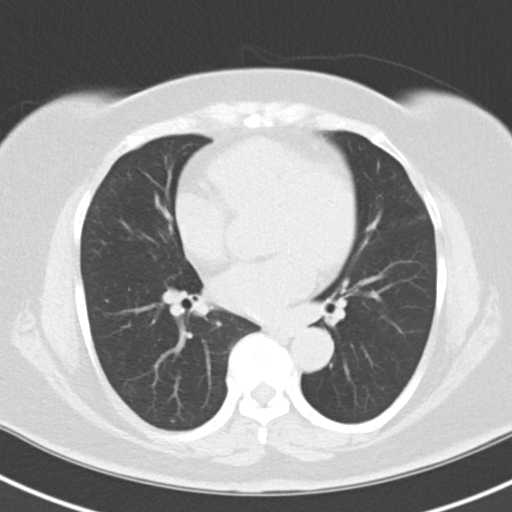
[im 30/57  lung]
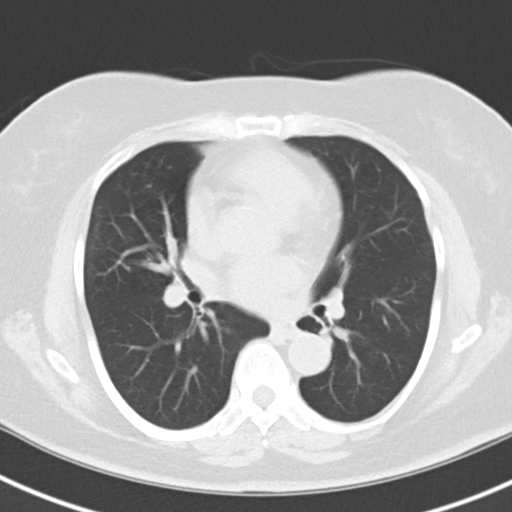
[im 37/57  lung]
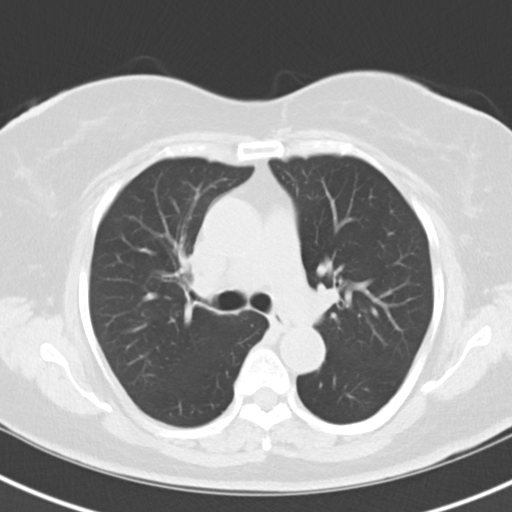
[im 40/57  mediastinal]
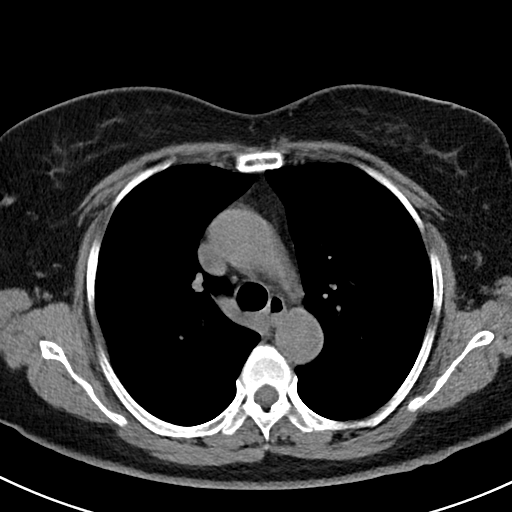
[im 40/57  lung]
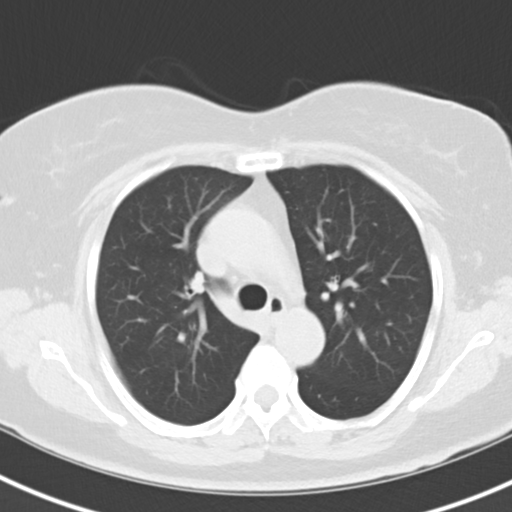
[im 43/57  lung]
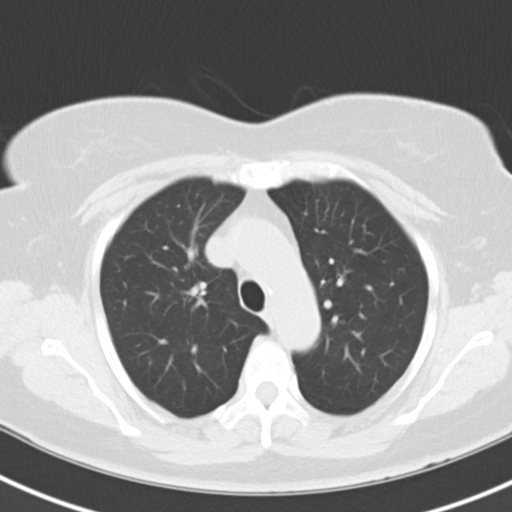
[im 50/57  lung]
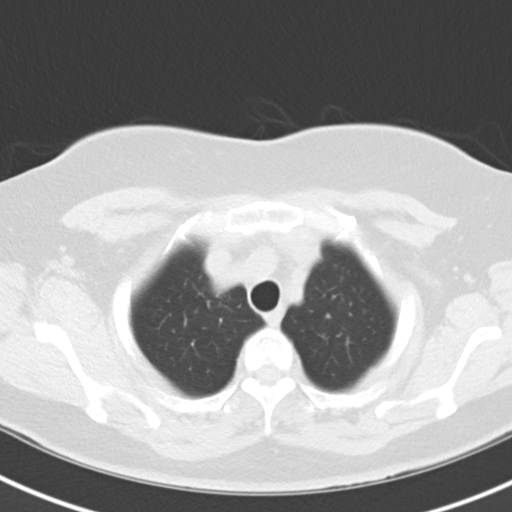
[im 53/57  lung]
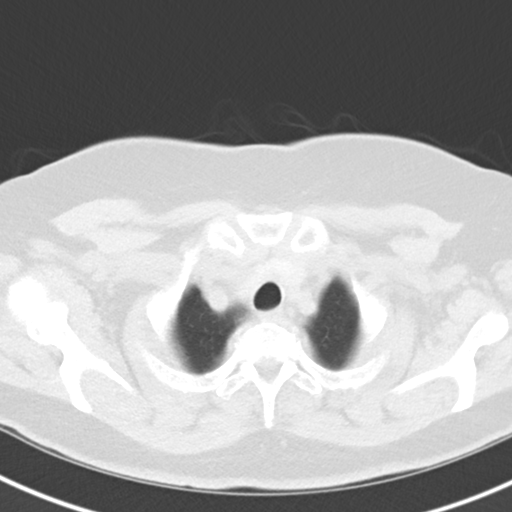

[Series 8: coronal · coronal · 0.59mm/px · 3 of 127 slices shown]
[im 26/127  lung]
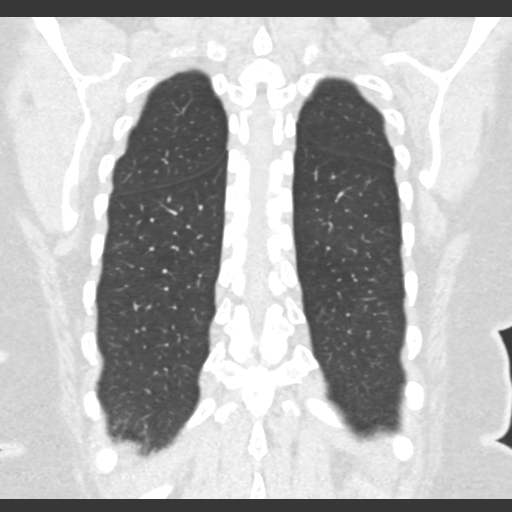
[im 51/127  lung]
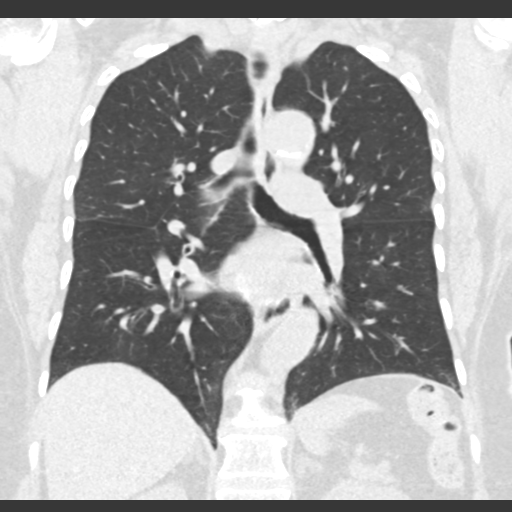
[im 76/127  lung]
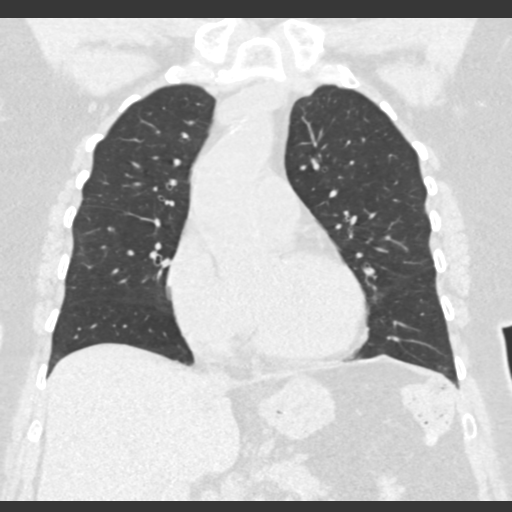

[15 of 36 positions shown; findings below may reference images not displayed]

FINDINGS: Mediastinum/Lymph Nodes: No pathologically enlarged mediastinal or
axillary lymph nodes. Hilar regions are difficult to definitively
evaluate without IV contrast but appear grossly unremarkable. Heart
size normal. No pericardial effusion.

Lungs/Pleura: No significant subpleural reticulation, traction
bronchiectasis/ bronchiolectasis, ground-glass, architectural
distortion or honeycombing. Mild scattered scarring in the right
middle lobe and lingula, as on 06/26/2006. No pleural fluid. No air
trapping. Airway is unremarkable.

Upper abdomen: Low-attenuation lesion in the left hepatic lobe
measures 9 mm, stable and likely a cyst. Visualized portions of the
liver, adrenal glands, kidneys, spleen, pancreas, stomach and bowel
are grossly unremarkable. No upper abdominal adenopathy.

Musculoskeletal: No worrisome lytic or sclerotic lesions.
IMPRESSION: No evidence of interstitial lung disease. No findings to explain the
patient's worsening shortness of breath with exertion.

## 2016-12-06 DIAGNOSIS — J4521 Mild intermittent asthma with (acute) exacerbation: Secondary | ICD-10-CM | POA: Diagnosis not present

## 2016-12-06 DIAGNOSIS — I10 Essential (primary) hypertension: Secondary | ICD-10-CM | POA: Diagnosis not present

## 2016-12-06 DIAGNOSIS — R0602 Shortness of breath: Secondary | ICD-10-CM | POA: Diagnosis not present

## 2016-12-06 DIAGNOSIS — Z87891 Personal history of nicotine dependence: Secondary | ICD-10-CM | POA: Diagnosis not present

## 2016-12-06 DIAGNOSIS — J45901 Unspecified asthma with (acute) exacerbation: Secondary | ICD-10-CM | POA: Diagnosis not present

## 2016-12-13 DIAGNOSIS — Z139 Encounter for screening, unspecified: Secondary | ICD-10-CM | POA: Diagnosis not present

## 2016-12-13 DIAGNOSIS — K9089 Other intestinal malabsorption: Secondary | ICD-10-CM | POA: Diagnosis not present

## 2016-12-13 DIAGNOSIS — R490 Dysphonia: Secondary | ICD-10-CM | POA: Diagnosis not present

## 2016-12-13 DIAGNOSIS — Z23 Encounter for immunization: Secondary | ICD-10-CM | POA: Diagnosis not present

## 2016-12-13 DIAGNOSIS — Z87891 Personal history of nicotine dependence: Secondary | ICD-10-CM | POA: Diagnosis not present

## 2016-12-13 DIAGNOSIS — R799 Abnormal finding of blood chemistry, unspecified: Secondary | ICD-10-CM | POA: Diagnosis not present

## 2016-12-13 DIAGNOSIS — D649 Anemia, unspecified: Secondary | ICD-10-CM | POA: Diagnosis not present

## 2016-12-18 IMAGING — US US SOFT TISSUE HEAD/NECK
1 series · 13 of 25 positions shown · non-contrast
Comparison: None.

CLINICAL DATA: Hypercalcemia

EXAM:
THYROID ULTRASOUND
TECHNIQUE: Ultrasound examination of the thyroid gland and adjacent soft
tissues was performed.

[Series 1: us soft tissue head/neck · 0.09mm/px · 13 of 33 slices shown]
[im 1/33]
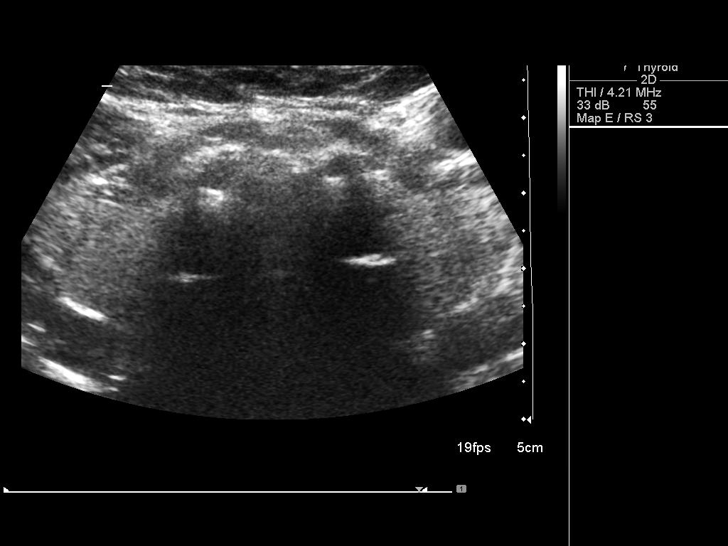
[im 3/33]
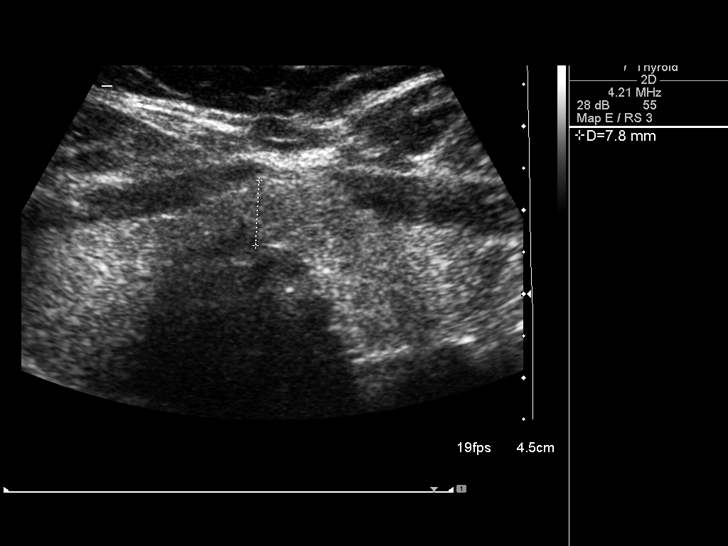
[im 6/33]
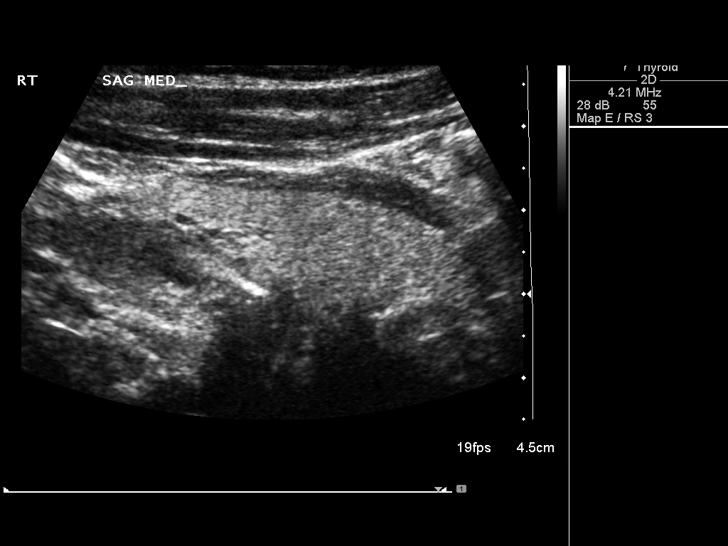
[im 9/33]
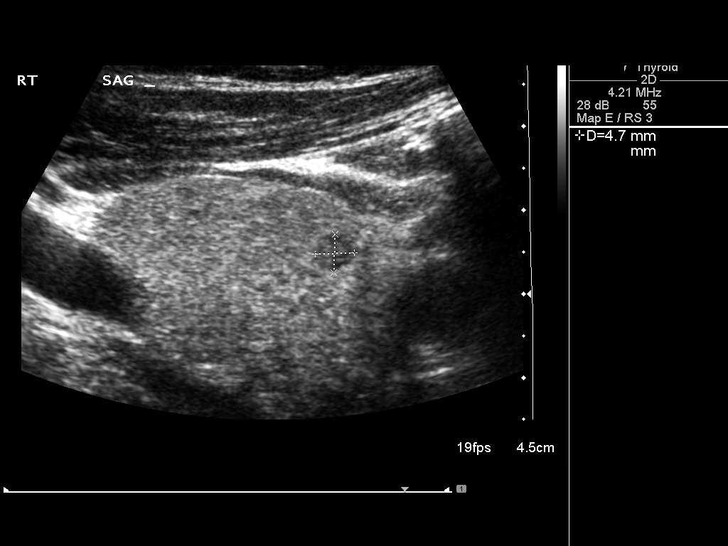
[im 11/33]
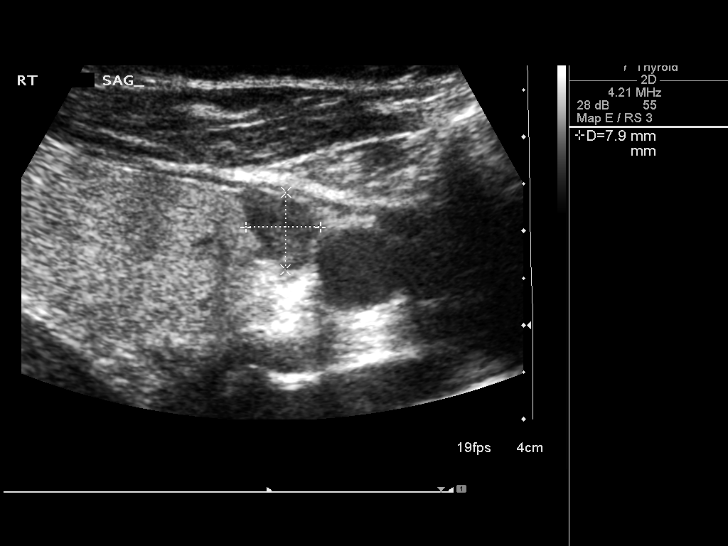
[im 14/33]
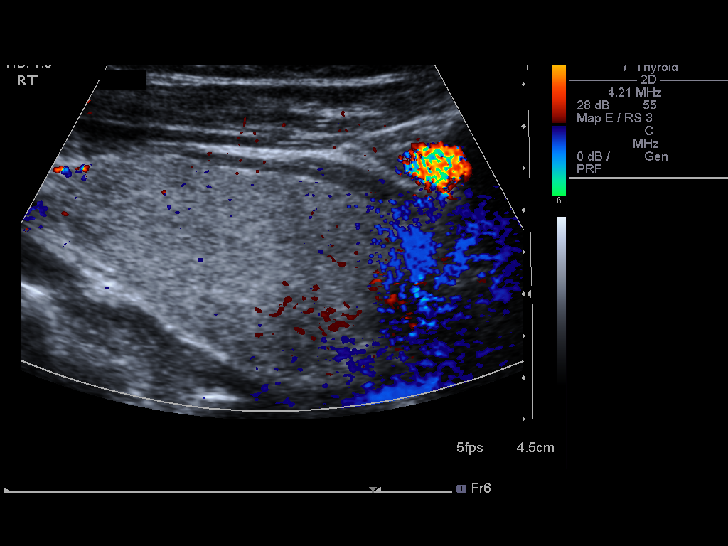
[im 17/33]
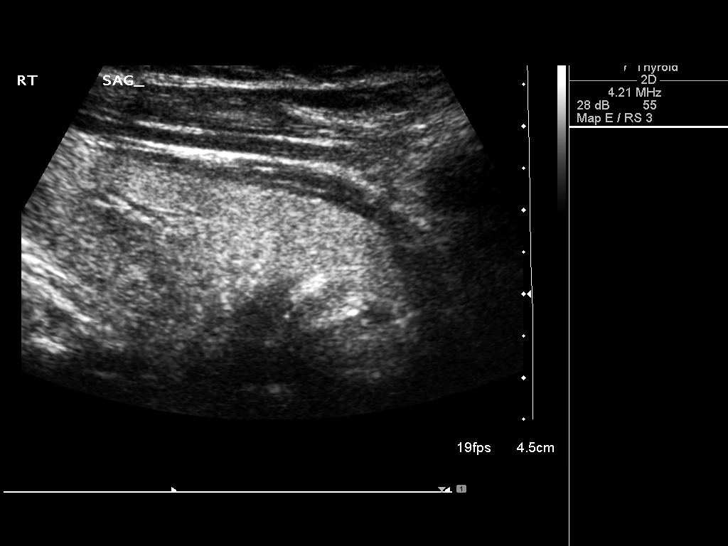
[im 19/33]
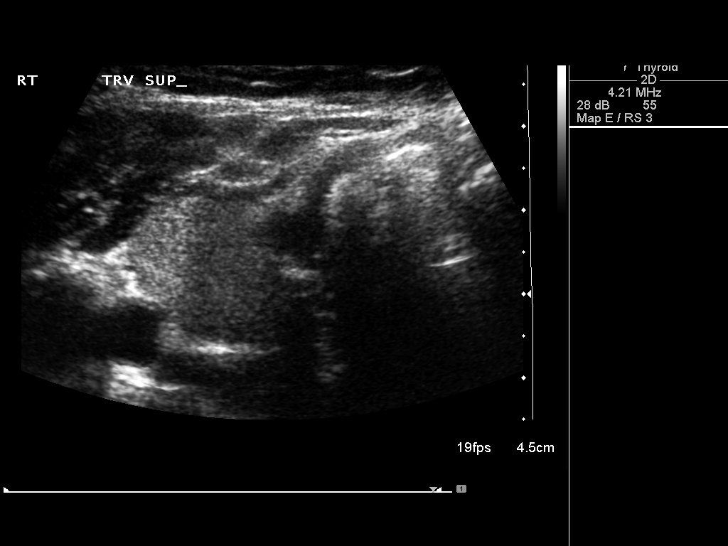
[im 22/33]
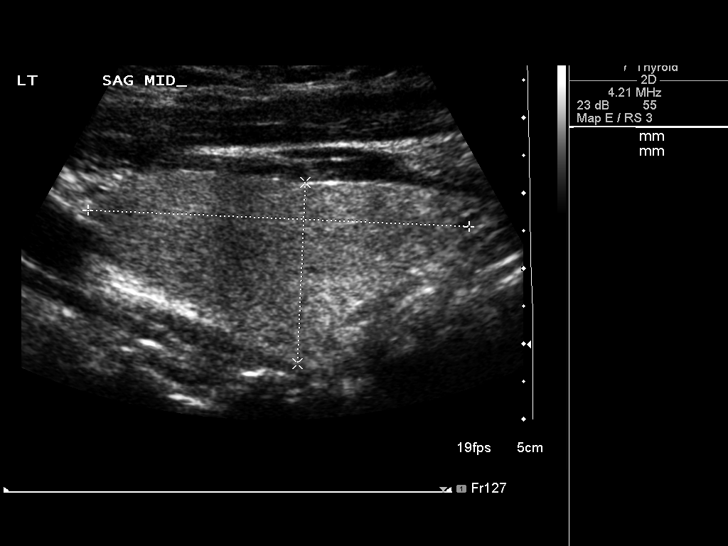
[im 25/33]
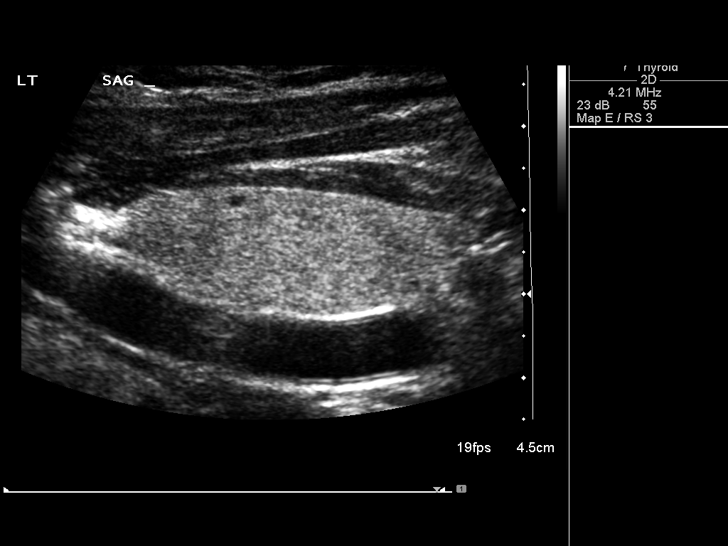
[im 27/33]
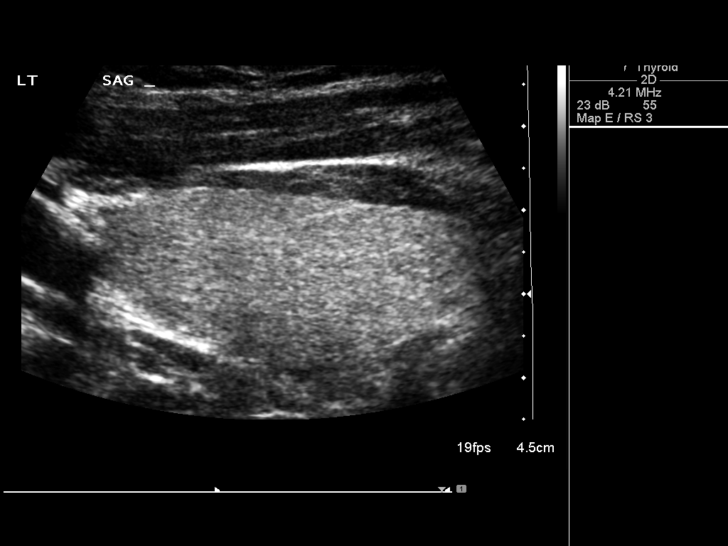
[im 30/33]
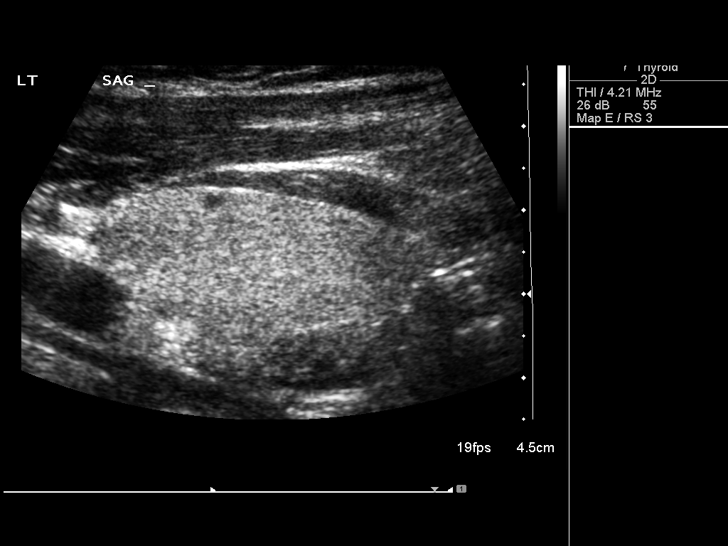
[im 33/33]
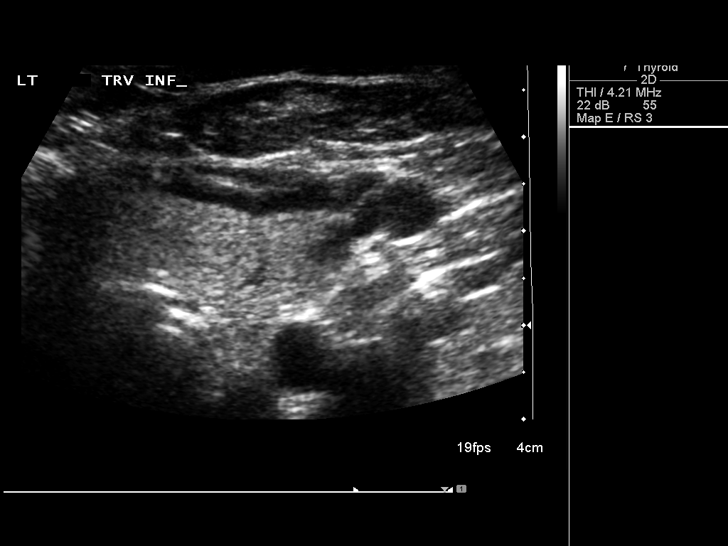

[13 of 25 positions shown; findings below may reference images not displayed]

FINDINGS: There is relative homogeneity the thyroid parenchymal echotexture.

Right thyroid lobe

Measurements: Normal in size measuring 4.6 x 2.4 x 2.7 cm.

Right, inferior- 0.5 x 0.5 x 0.4 cm - hypoechoic, solid

Right, inferior, anterior, exophytic - 0.8 x 0.4 x 1.0 cm -
hypoechoic, solid

Left thyroid lobe

Measurements: Normal in size measuring 5.1 x 2.4 x 2.1 cm.

Left, superior, anterior - 0.2 cm - anechoic, cystic

Isthmus

Thickness: Normal in size measuring 0.8 cm 0.6 cm in diameter.

No discrete nodule or mass identified within the thyroid isthmus.

Lymphadenopathy

None visualized.
IMPRESSION: Punctate (approximately 1 cm) exophytic nodule arising from the
inferior aspect the right lobe of the thyroid may represent either a
exophytic thyroid nodule versus an adjacent parathyroid adenoma.
Correlation with PTH levels is recommended. If elevated, further
evaluation could be performed with parathyroid scan as clinically
indicated.

If this nodule proves to represent a exophytic thyroid nodule, it
does not currently meet imaging criteria to recommend percutaneous
sampling. This recommendation follows the consensus statement:
Management of Thyroid Nodules Detected at US: Society of
Radiologists in Ultrasound Consensus Conference Statement. Radiology

## 2016-12-21 DIAGNOSIS — L720 Epidermal cyst: Secondary | ICD-10-CM | POA: Diagnosis not present

## 2016-12-21 DIAGNOSIS — D179 Benign lipomatous neoplasm, unspecified: Secondary | ICD-10-CM | POA: Diagnosis not present

## 2016-12-27 DIAGNOSIS — I10 Essential (primary) hypertension: Secondary | ICD-10-CM | POA: Diagnosis not present

## 2016-12-27 DIAGNOSIS — E785 Hyperlipidemia, unspecified: Secondary | ICD-10-CM | POA: Diagnosis not present

## 2016-12-27 DIAGNOSIS — J4520 Mild intermittent asthma, uncomplicated: Secondary | ICD-10-CM | POA: Diagnosis not present

## 2016-12-31 DIAGNOSIS — J320 Chronic maxillary sinusitis: Secondary | ICD-10-CM | POA: Diagnosis not present

## 2016-12-31 DIAGNOSIS — H6123 Impacted cerumen, bilateral: Secondary | ICD-10-CM | POA: Diagnosis not present

## 2016-12-31 DIAGNOSIS — R490 Dysphonia: Secondary | ICD-10-CM | POA: Diagnosis not present

## 2016-12-31 DIAGNOSIS — K219 Gastro-esophageal reflux disease without esophagitis: Secondary | ICD-10-CM | POA: Diagnosis not present

## 2018-05-16 ENCOUNTER — Encounter: Payer: Self-pay | Admitting: Gastroenterology
